# Patient Record
Sex: Female | Born: 1960 | Race: White | Hispanic: No | Marital: Married | Smoking: Never smoker
Health system: Southern US, Community
[De-identification: ages and names within clinical notes are randomized; demographics above are authoritative.]

## PROBLEM LIST (undated history)

## (undated) DIAGNOSIS — G43909 Migraine, unspecified, not intractable, without status migrainosus: Secondary | ICD-10-CM

## (undated) DIAGNOSIS — I459 Conduction disorder, unspecified: Secondary | ICD-10-CM

## (undated) HISTORY — DX: Conduction disorder, unspecified: I45.9

## (undated) HISTORY — PX: OVARIAN CYST REMOVAL: SHX89

---

## 1999-04-21 ENCOUNTER — Inpatient Hospital Stay (HOSPITAL_COMMUNITY): Admission: AD | Admit: 1999-04-21 | Discharge: 1999-04-23 | Payer: Self-pay | Admitting: Obstetrics and Gynecology

## 1999-04-24 ENCOUNTER — Encounter: Admission: RE | Admit: 1999-04-24 | Discharge: 1999-07-23 | Payer: Self-pay | Admitting: Obstetrics and Gynecology

## 1999-05-18 ENCOUNTER — Other Ambulatory Visit: Admission: RE | Admit: 1999-05-18 | Discharge: 1999-05-18 | Payer: Self-pay | Admitting: Obstetrics and Gynecology

## 2000-03-13 ENCOUNTER — Other Ambulatory Visit: Admission: RE | Admit: 2000-03-13 | Discharge: 2000-03-13 | Payer: Self-pay | Admitting: Obstetrics and Gynecology

## 2000-03-21 ENCOUNTER — Ambulatory Visit (HOSPITAL_COMMUNITY): Admission: RE | Admit: 2000-03-21 | Discharge: 2000-03-21 | Payer: Self-pay | Admitting: Obstetrics and Gynecology

## 2000-03-21 ENCOUNTER — Encounter: Payer: Self-pay | Admitting: Obstetrics and Gynecology

## 2000-03-22 ENCOUNTER — Ambulatory Visit (HOSPITAL_COMMUNITY): Admission: RE | Admit: 2000-03-22 | Discharge: 2000-03-22 | Payer: Self-pay | Admitting: Obstetrics and Gynecology

## 2000-03-22 ENCOUNTER — Encounter (INDEPENDENT_AMBULATORY_CARE_PROVIDER_SITE_OTHER): Payer: Self-pay

## 2001-02-08 ENCOUNTER — Inpatient Hospital Stay (HOSPITAL_COMMUNITY): Admission: AD | Admit: 2001-02-08 | Discharge: 2001-02-09 | Payer: Self-pay | Admitting: Obstetrics and Gynecology

## 2001-04-17 ENCOUNTER — Other Ambulatory Visit: Admission: RE | Admit: 2001-04-17 | Discharge: 2001-04-17 | Payer: Self-pay | Admitting: Obstetrics and Gynecology

## 2004-04-21 ENCOUNTER — Encounter: Admission: RE | Admit: 2004-04-21 | Discharge: 2004-04-21 | Payer: Self-pay | Admitting: Family Medicine

## 2005-12-05 ENCOUNTER — Emergency Department (HOSPITAL_COMMUNITY): Admission: EM | Admit: 2005-12-05 | Discharge: 2005-12-06 | Payer: Self-pay | Admitting: Emergency Medicine

## 2008-06-30 HISTORY — PX: TRANSTHORACIC ECHOCARDIOGRAM: SHX275

## 2008-07-18 ENCOUNTER — Emergency Department (HOSPITAL_COMMUNITY): Admission: EM | Admit: 2008-07-18 | Discharge: 2008-07-18 | Payer: Self-pay | Admitting: Emergency Medicine

## 2010-08-12 LAB — CBC
HCT: 38.2 % (ref 36.0–46.0)
Hemoglobin: 13.1 g/dL (ref 12.0–15.0)
MCHC: 34.4 g/dL (ref 30.0–36.0)
MCV: 91.3 fL (ref 78.0–100.0)
Platelets: 161 10*3/uL (ref 150–400)
RBC: 4.19 MIL/uL (ref 3.87–5.11)
RDW: 13.4 % (ref 11.5–15.5)
WBC: 6.7 10*3/uL (ref 4.0–10.5)

## 2010-08-12 LAB — LIPID PANEL
Cholesterol: 174 mg/dL (ref 0–200)
HDL: 54 mg/dL (ref >39)
LDL Cholesterol: 115 mg/dL — ABNORMAL HIGH (ref 0–99)
Total CHOL/HDL Ratio: 3.2 RATIO
Triglycerides: 23 mg/dL (ref <150)
VLDL: 5 mg/dL (ref 0–40)

## 2010-08-12 LAB — CK TOTAL AND CKMB (NOT AT ARMC)
CK, MB: 0.5 ng/mL (ref 0.3–4.0)
Total CK: 52 U/L (ref 7–177)

## 2010-08-12 LAB — COMPREHENSIVE METABOLIC PANEL
Albumin: 4.1 g/dL (ref 3.5–5.2)
BUN: 7 mg/dL (ref 6–23)
Creatinine, Ser: 0.63 mg/dL (ref 0.4–1.2)
Total Protein: 7.1 g/dL (ref 6.0–8.3)

## 2010-08-12 LAB — DIFFERENTIAL
Basophils Absolute: 0 10*3/uL (ref 0.0–0.1)
Lymphocytes Relative: 11 % — ABNORMAL LOW (ref 12–46)
Lymphs Abs: 0.7 10*3/uL (ref 0.7–4.0)
Monocytes Absolute: 0.2 10*3/uL (ref 0.1–1.0)
Monocytes Relative: 4 % (ref 3–12)
Neutro Abs: 5.7 10*3/uL (ref 1.7–7.7)

## 2010-08-12 LAB — TSH: TSH: 2.782 u[IU]/mL (ref 0.350–4.500)

## 2010-09-17 NOTE — H&P (Signed)
[facility]  Patient:    Yvonne Parker, Yvonne Parker Visit Number: [id number] MRN: [id number]          Service Type: Attending:  Duke Salvia. Marcelle Overlie, M.D. Dictated by:   Duke Salvia. Marcelle Overlie, M.D.                           History and Physical  SCHEDULED ADMISSION DATE:     February 08, 2001.  CHIEF COMPLAINT:              For labor induction at term.  HISTORY OF PRESENT ILLNESS:   This is a 50 year old, G5, P2-0-0-2, EDD by dates of October 20, by 17-week ultrasound October 16, presents for labor induction with a favorable cervix at 38-1/2 weeks due to history of rapid labors. Her group B strep screen was negative. Blood type is A positive. Rubella titer is positive. Last labor in 2000 was less than one hour from beginning to delivery and she has been 3 cm since 36 weeks.  PAST MEDICAL HISTORY:         She had a 4-cm fibroid noted at screening ultrasound, ectopic x 1, two vaginal deliveries and a D&E for an SAB. Also, surgery for a prior ruptured ovarian cyst.  ALLERGIES:                    PENICILLIN and SULFA.  PHYSICAL EXAMINATION:  VITAL SIGNS:                  Temperature 98.2, blood pressure 124/78.  HEENT:                        Unremarkable.  NECK:                         Supple without masses.  LUNGS:                        Clear.  CARDIOVASCULAR:               Regular rate and rhythm without murmurs, rubs, gallops.  BREASTS:                      Not examined.  ABDOMEN:                      Term fundal height. Fetal heart rate 140.  PELVIC:                       Cervix was 3, 50%, and vertex.  EXTREMITIES:                  Unremarkable.  NEUROLOGIC:                   Unremarkable.  IMPRESSION:                   1. A 38-1/2 week intrauterine pregnancy.                               2. History of rapid labors.  PLAN:                         AROM induction. Dictated by:   Duke Salvia.  Marcelle Overlie, M.D. Attending:  Duke Salvia. Marcelle Overlie,  M.D. DD:  02/07/01 TD:  02/07/01 Job: [id number]

## 2010-09-17 NOTE — H&P (Signed)
[facility]  Patient:    Yvonne Parker, GRAS                      MRN: [id number] Adm. Date:  03/22/00 Attending:  Guy Sandifer. Arleta Creek, M.D.                         History and Physical  CHIEF COMPLAINT:              Missed abortion.  HISTORY OF PRESENT ILLNESS:   This patient is a 50 year old married, white female, G4, P2, ectopic 1 with a last menstrual period of January 10, 2000. She was initially seen on March 13, 2000, when she was 10-2/7 weeks by dates.  Ultrasound was done because of size less than dates on physical examination.  At that time an intrauterine gestational sac consistent with eight weeks and one day was noted.  No fetal pole was noted.  A 3.5 cm left corpus luteum cyst was noted.  Quantative hCGs were obtained and were 89,673 on March 13, 2000, and 91,347 on March 15, 2000.  Ultrasound on March 21, 2000, reveals an empty intrauterine gestational sac consistent with seven weeks and two days.  Small adjacent cyst in the ______ .  Left corpus luteum cyst measures 3.4 cm.  Diagnosis of missed abortion is discussed with the patient and her husband.  Options of management is discussed.  A D&C is recommended.  Risks and complications are discussed but not limited to infection, bleeding and uterine perforation with organ damage.  All questions are answered.  The patient is being admitted for dilation and evacuation.  PAST MEDICAL HISTORY:         Migraine headaches.  PAST SURGICAL HISTORY:        Wisdom teeth removal.  Ectopic pregnancy in 1988.  FAMILY HISTORY:               Insulin-dependent diabetes in maternal grandmother.  PKU in sister.  OBSTETRICAL HISTORY:          A vaginal delivery x 2.  Ectopic pregnancy x 1.  SOCIAL HISTORY:               The patient denies tobacco, alcohol or drug abuse.  MEDICATIONS:                  Prenatal vitamins.  ALLERGIES:                    PENICILLIN and SULFA.  REVIEW OF  SYSTEMS:            Negative except as above.  PHYSICAL EXAMINATION:  VITAL SIGNS:                  Height 5 feet 9 inches, weight 145 pounds. Blood pressure 104/70.  HEENT:                        Without thyromegaly.  LUNGS:                        Clear to auscultation.  HEART:                        Regular rate and rhythm.  BACK:  Without CVA tenderness.  BREASTS:                      Without masses, retractions or discharge.  ABDOMEN:                      Soft, nontender without masses.  PELVIC:                       Vulva, vagina and cervix without lesions. Uterus is eight weeks in size.  Adnexa nontender without masses.  EXTREMITIES:                  Grossly within normal limits.  NEUROLOGICAL:                 Grossly within normal limits.  LABORATORY DATA:              Blood type is A positive.  ASSESSMENT:                   Missed abortion.  PLAN:                         Dilation and evacuation. DD:  03/21/00 TD:  03/21/00 Job: [id number]

## 2010-09-17 NOTE — Op Note (Signed)
[facility]  Patient:    Yvonne Parker, Yvonne Parker                    MRN: [id number] Proc. Date: 03/22/00 Adm. Date:  [id number] Attending:  Cordelia Pen Ii                           Operative Report  PREOPERATIVE DIAGNOSIS:         Missed abortion.  POSTOPERATIVE DIAGNOSIS:        Missed abortion.  OPERATION:                      Dilatation and evacuation.  SURGEON:                        Guy Sandifer. Arleta Creek, M.D.  ANESTHESIA:                     MAC with 1% Xylocaine paracervical block.  ANESTHESIOLOGIST:               Ellison Hughs., M.D.  ESTIMATED BLOOD LOSS:           50 cc.  INDICATIONS AND CONSENT:        The patient is a 50 year old married white female, G4, P2, ectopic 1, with an LMP of January 10, 2000.  She has a missed abortion.  Details are dictated in the admission history and physical. Dilatation and evacuation is discussed.  Possible risks and complications are discussed the day before and the day of surgery with the patient and her husband including but not limited to infection, uterine perforation, organ damage, bleeding and transfusion of blood products.  All questions were answered and consent is signed on the chart.  DESCRIPTION OF PROCEDURE:       The patient was taken to the operating room and placed in the dorsal supine position were sedation was given.  She was then placed in the dorsal lithotomy position where she was prepped, bladder straight catheterized and she was draped in a sterile fashion.  Examination reveals the cervix to be closed, uterus to be approximately 8 weeks in size. Bivalve speculum was placed in the vagina and an endocervical polyp is noted. The polyp is removed in a simple fashion with ring clamp.  The anterior lip of the cervix is then injected with 1% Xylocaine and grasped with a single-tooth tenaculum.  The paracervical block is placed at 2, 4, 5, 7, 8 and 10 oclock positions  with 1% Xylocaine.  The cervix is then gently and progressively dilated to a 31 Pratt dilator.  A #8 curved curet was then placed in the uterus and suction curettage was carried out for obvious products of conception.  Then 10 units of Pitocin was added to the remaining 400-500 cc of IV fluids.  Alternating sharp and suction curettage was carried out until the cavity was clean.  Good hemostasis is noted.  The procedure was terminated. All counts were terminated.  The patient was awakened and taken to the recovery room in stable condition.  Blood type is A positive.  Methergine 0.2 mg p.o. t.i.d. for six doses was given.  Follow up in the office in two weeks. DD:  03/22/00 TD:  03/23/00 Job: [id number]

## 2011-10-26 ENCOUNTER — Encounter (HOSPITAL_COMMUNITY): Payer: Self-pay | Admitting: Neurology

## 2011-10-26 ENCOUNTER — Emergency Department (HOSPITAL_COMMUNITY)
Admission: EM | Admit: 2011-10-26 | Discharge: 2011-10-26 | Disposition: A | Discharge status: Home / Self Care | Payer: 59 | Attending: Emergency Medicine | Admitting: Emergency Medicine

## 2011-10-26 ENCOUNTER — Emergency Department (HOSPITAL_COMMUNITY): Payer: 59

## 2011-10-26 DIAGNOSIS — S060X1A Concussion with loss of consciousness of 30 minutes or less, initial encounter: Secondary | ICD-10-CM | POA: Insufficient documentation

## 2011-10-26 DIAGNOSIS — S1093XA Contusion of unspecified part of neck, initial encounter: Secondary | ICD-10-CM | POA: Insufficient documentation

## 2011-10-26 DIAGNOSIS — S060XAA Concussion with loss of consciousness status unknown, initial encounter: Secondary | ICD-10-CM

## 2011-10-26 DIAGNOSIS — S0003XA Contusion of scalp, initial encounter: Secondary | ICD-10-CM | POA: Insufficient documentation

## 2011-10-26 DIAGNOSIS — G43909 Migraine, unspecified, not intractable, without status migrainosus: Secondary | ICD-10-CM | POA: Insufficient documentation

## 2011-10-26 DIAGNOSIS — R42 Dizziness and giddiness: Secondary | ICD-10-CM | POA: Insufficient documentation

## 2011-10-26 DIAGNOSIS — W010XXA Fall on same level from slipping, tripping and stumbling without subsequent striking against object, initial encounter: Secondary | ICD-10-CM | POA: Insufficient documentation

## 2011-10-26 DIAGNOSIS — W19XXXA Unspecified fall, initial encounter: Secondary | ICD-10-CM

## 2011-10-26 DIAGNOSIS — Y92009 Unspecified place in unspecified non-institutional (private) residence as the place of occurrence of the external cause: Secondary | ICD-10-CM | POA: Insufficient documentation

## 2011-10-26 DIAGNOSIS — S060X9A Concussion with loss of consciousness of unspecified duration, initial encounter: Secondary | ICD-10-CM

## 2011-10-26 DIAGNOSIS — R51 Headache: Secondary | ICD-10-CM | POA: Insufficient documentation

## 2011-10-26 HISTORY — DX: Migraine, unspecified, not intractable, without status migrainosus: G43.909

## 2011-10-26 NOTE — Discharge Instructions (Signed)
Head Injury, Adult You have had a head injury that does not appear serious at this time. A concussion is a state of changed mental ability, usually from a blow to the head. You should take clear liquids for the rest of the day and then resume your regular diet. You should not take sedatives or alcoholic beverages for as long as directed by your caregiver after discharge. After injuries such as yours, most problems occur within the first 24 hours. SYMPTOMS These minor symptoms may be experienced after discharge:  Memory difficulties.   Dizziness.   Headaches.   Double vision.   Hearing difficulties.   Depression.   Tiredness.   Weakness.   Difficulty with concentration.  If you experience any of these problems, you should not be alarmed. A concussion requires a few days for recovery. Many patients with head injuries frequently experience such symptoms. Usually, these problems disappear without medical care. If symptoms last for more than one day, notify your caregiver. See your caregiver sooner if symptoms are becoming worse rather than better. HOME CARE INSTRUCTIONS   During the next 24 hours you must stay with someone who can watch you for the warning signs listed below.  Although it is unlikely that serious side effects will occur, you should be aware of signs and symptoms which may necessitate your return to this location. Side effects may occur up to 7 - 10 days following the injury. It is important for you to carefully monitor your condition and contact your caregiver or seek immediate medical attention if there is a change in your condition. SEEK IMMEDIATE MEDICAL CARE IF:   There is confusion or drowsiness.   You can not awaken the injured person.   There is nausea (feeling sick to your stomach) or continued, forceful vomiting.   You notice dizziness or unsteadiness which is getting worse, or inability to walk.   You have convulsions or unconsciousness.   You  experience severe, persistent headaches not relieved by over-the-counter or prescription medicines for pain. (Do not take aspirin as this impairs clotting abilities). Take other pain medications only as directed.   You can not use arms or legs normally.   There is clear or bloody discharge from the nose or ears.  MAKE SURE YOU:   Understand these instructions.   Will watch your condition.   Will get help right away if you are not doing well or get worse.  Document Released: 04/18/2005 Document Revised: 04/07/2011 Document Reviewed: 03/06/2009 [facility] Patient Information 2012 [place], [place].  Scalp Hematoma A bruise of the scalp causes swelling from an accumulation of blood in the area of the injury. This is a common problem. This problem is also called a scalp hematoma. This normally is not serious. Usually a scalp hematoma causes only mild local pain or headache. It usually disappears after 2 to 3 days with proper treatment.  You should apply ice packs to the swollen area for 20 to 30 minutes every 2 to 3 hours until the swelling improves. Only take over-the-counter or prescription medicines for pain and discomfort as directed by your caregiver. It is best to avoid aspirin because this may increase bleeding. You may have a mild headache, slight dizziness, nausea, or weakness for the next few days. This usually clears up with rest.  SEEK IMMEDIATE MEDICAL CARE IF:  You develop severe pain or headache, unrelieved by medicine.   You develop unusual sleepiness, confusion, personality changes, or difficulty with coordination or walking.   You  develop a persistent nose bleed, double or blurred vision, or unusual drainage from the nose or ear.   You develop numbness or weakness in the limbs.  Document Released: 05/26/2004 Document Revised: 04/07/2011 Document Reviewed: 03/03/2009 [facility] Patient Information 2012 [place], [place].

## 2011-10-26 NOTE — ED Provider Notes (Signed)
History     CSN: [id number]  Arrival date & time 10/26/11  4098   First MD Initiated Contact with Patient 10/26/11 [phone]      Chief Complaint  Patient presents with  . Fall    (Consider location/radiation/quality/duration/timing/severity/associated sxs/prior treatment) HPI  51 year old female presents complaining of fall. Patient states she was letting her dog outside this a.m. and while she was in a hurry she slipped on a throw rug, fell backward and hits her head against the wall (against wall molding). Patient has a positive loss of consciousness.  Sts she was somewhat confused after the fall and felt dizzy afterward.  C/o pain to back of head.  Denies any other trauma.  Denies change in vision, facial pain, neck pain, numbness or weakness.  LMP 3 weeks ago.  Denies any recent alcohol or rec drug use.  Husband were nearby when she fell.  Pt denies any precipitating sxs prior to fall.    Past Medical History  Diagnosis Date  . Migraines     No past surgical history on file.  No family history on file.  History  Substance Use Topics  . Smoking status: Not on file  . Smokeless tobacco: Not on file  . Alcohol Use:     OB History    Grav Para Term Preterm Abortions TAB SAB Ect Mult Living                  Review of Systems  Constitutional: Negative for fever, activity change and fatigue.  HENT: Negative for facial swelling.   Eyes: Negative for pain.  Neurological: Positive for dizziness, light-headedness and headaches. Negative for speech difficulty, weakness and numbness.    Allergies  Penicillins and Sulfa antibiotics  Home Medications  No current outpatient prescriptions on file.  There were no vitals taken for this visit.  Physical Exam  Nursing note and vitals reviewed. Constitutional: She is oriented to person, place, and time. She appears well-developed and well-nourished. No distress.  HENT:  Head: Normocephalic.  Right Ear: External ear normal.    Left Ear: External ear normal.  Nose: Nose normal.       Contusion noted to occipital protuberance with tenderness on palpation but no obvious deformity, or break in skin.    Eyes: Conjunctivae and EOM are normal. Pupils are equal, round, and reactive to light.  Neck: Normal range of motion. Neck supple.  Cardiovascular: Normal rate and regular rhythm.   Pulmonary/Chest: Effort normal and breath sounds normal. No respiratory distress. She exhibits no tenderness.  Abdominal: There is no tenderness.  Musculoskeletal: Normal range of motion. She exhibits no edema and no tenderness.  Neurological: She is alert and oriented to person, place, and time. She has normal reflexes. She displays normal reflexes. No cranial nerve deficit. She exhibits normal muscle tone. Coordination normal.       Negative Romberg. Normal finger to nose, heels to shin, normal gait.  Patella DTR 2+ bilat.    Skin: Skin is warm.  Psychiatric: She has a normal mood and affect.    ED Course  Procedures (including critical care time)  Labs Reviewed - No data to display No results found.   Ct Head Wo Contrast  10/26/2011  *RADIOLOGY REPORT*  Clinical Data: Fall striking occiput on door, loss of consciousness, dizziness, confusion, soft tissue swelling  CT HEAD WITHOUT CONTRAST  Technique:  Contiguous axial images were obtained from the base of the skull through the vertex without contrast.  Comparison:  None  Findings: Normal ventricular morphology. No midline shift or mass effect. Normal appearance of brain parenchyma. No intracranial hemorrhage, mass lesion, or acute infarction. Visualized paranasal sinuses and mastoid air cells clear. Bones unremarkable. Left occipital scalp soft tissue swelling and small hematoma identified.  IMPRESSION: No acute intracranial abnormalities.  Original Report Authenticated By: Lollie Marrow, M.D.    1. Fall 2. Concussion 3. Scalp hematoma  MDM  Patient with mechanical fall, and has a  hematoma to the back of her head. She is otherwise in no acute distress. No focal neuro deficit. She is alert and oriented x4.     9:04 AM CT of head reveals no acute fractures or bleeding. Result discussed with patient.  Recommend bedrest and avoid stimulated activity x 24 hrs.  Pt voice understanding.  Work note given.       Fayrene Helper, PA-C 10/26/11 [phone]

## 2011-10-26 NOTE — ED Notes (Signed)
Per ems- Pt fell this morning, slipped on rug which was on hardwood floors, hit head on door. Positive LOC for a short time, fall witnessed by husband, confusion after the fall. Pt has hematoma to back of head. Pt initially c/o nausea, currently dizzy. Pt a x 4. 118/72, HR 89. CBG 96.

## 2011-10-26 NOTE — ED Provider Notes (Signed)
Medical screening examination/treatment/procedure(s) were performed by non-physician practitioner and as supervising physician I was immediately available for consultation/collaboration.  Raahim Shartzer K Linker, MD 10/26/11 0918 

## 2014-05-09 ENCOUNTER — Telehealth: Payer: Self-pay | Admitting: Internal Medicine

## 2014-05-09 NOTE — Telephone Encounter (Signed)
Received records from [facility] (Dr Deland Pretty) for appointment on 05/30/14 with Dr Debara Pickett.  Records given to [facility] (medical records) for Dr [facility] schedule on 05/30/14.  lp

## 2014-05-30 ENCOUNTER — Encounter: Payer: Self-pay | Admitting: Internal Medicine

## 2014-05-30 ENCOUNTER — Ambulatory Visit (INDEPENDENT_AMBULATORY_CARE_PROVIDER_SITE_OTHER): Payer: 59 | Admitting: Internal Medicine

## 2014-05-30 VITALS — BP 126/78 | HR 110 | Ht 69.0 in | Wt 142.5 lb

## 2014-05-30 DIAGNOSIS — I452 Bifascicular block: Secondary | ICD-10-CM | POA: Insufficient documentation

## 2014-05-30 NOTE — Progress Notes (Signed)
OFFICE NOTE  Chief Complaint:  No complaints, told she has an abnormal EKG  Primary Care Physician: Horatio Pel, MD  HPI:  Yvonne Parker is a pleasant 54 year old female who was kindly referred to me from Dr. Pennie Banter office  For evaluation of an abnormal EKG.  She  Recently establish care with them and underwent a screening electrocardiogram which was abnormal demonstrating a sinus rhythm with PVCs , left axis deviation and right bundle branch block with left anterior fascicular block.  She's been completely asymptomatic. She denies any worsening shortness of breath or chest pain. She is very active and has several children. She runs fairly regularly about 4 miles several times a week without any complaints.  She tells me that she had evaluation of possibly an abnormal EKG in 2010 by Dr. Rex Kras. At that time she apparently wore monitor and had an echocardiogram, but no abnormal findings were noted.  PMHx:  Past Medical History  Diagnosis Date  . Migraines     Past Surgical History  Procedure Laterality Date  . Ovarian cyst removal      FAMHx:  No family history on file.  SOCHx:   reports that she has never smoked. She does not have any smokeless tobacco history on file. She reports that she does not drink alcohol or use illicit drugs.  ALLERGIES:  Allergies  Allergen Reactions  . Penicillins Rash and Other (See Comments)    Fever  . Sulfa Antibiotics Rash and Other (See Comments)    Body aches    ROS: A comprehensive review of systems was negative.  HOME MEDS: No current outpatient prescriptions on file.   No current facility-administered medications for this visit.    LABS/IMAGING: No results found for this or any previous visit (from the past 48 hour(s)). No results found.  VITALS: BP 126/78 mmHg  Pulse 110  Ht 5\' 9"  (1.753 m)  Wt 142 lb 8 oz (64.638 kg)  BMI 21.03 kg/m2  EXAM: General appearance: alert and no distress Neck: no carotid  bruit and no JVD Lungs: clear to auscultation bilaterally Heart: regular rate and rhythm, S1, S2 normal, no murmur, click, rub or gallop Abdomen: soft, non-tender; bowel sounds normal; no masses,  no organomegaly Extremities: extremities normal, atraumatic, no cyanosis or edema Pulses: 2+ and symmetric Skin: Skin color, texture, turgor normal. No rashes or lesions Neurologic: Grossly normal Psych: Pleasant  EKG:  sinus tachycardia with PACs at 110, left axis deviation, left anterior fascicular block, right bundle branch block  ASSESSMENT: 1.  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  PACs and/or PVCs  PLAN: 1.    Mrs. Belnap has an abnormal EKG which may be an old finding. Is not clear how long she's had abnormalities on her EKG but she was evaluated for this apparently in 2010 by Dr. Rex Kras. I will try to get those old records which are likely in storage, but would be helpful to compare her old EKG to her present EKG. If there is been no significant change in her workup was negative at that time, given the fact that she is asymptomatic now, I would not likely recommend any further workup. There is a remote possibility that her conduction system disease will worsen over time possibly necessitating a  Pacemaker at some point. She will need reevaluation at some point in the future. I will contact her wife had a opportunity to review her prior records and if any further testing is necessary we  will advise her on that.  Thanks again for the kind referral.  Pixie Casino, MD, [facility] Attending Cardiologist [facility]  HILTY,Kenneth C 05/30/2014, 1:12 PM

## 2014-05-30 NOTE — Patient Instructions (Signed)
Your physician recommends that you schedule a follow-up appointment as needed.   We will call you about your previous EKG (once located) to see if further evaluation is neccessary

## 2014-06-06 ENCOUNTER — Encounter: Payer: Self-pay | Admitting: *Deleted

## 2014-06-06 ENCOUNTER — Encounter: Payer: Self-pay | Admitting: Internal Medicine

## 2014-06-06 NOTE — Progress Notes (Signed)
I reviewed Yvonne Parker prior EKG from 2010. This demonstrated right bundle branch block with left axis deviation and a rate of 85. Therefore her EKG changes are not new. She underwent an echo for that in 2010 which showed a mobile interatrial septal, no atrial septal defect, EF greater than 55% and mild mitral regurgitation and trace tricuspid regurgitation with mild pulmonic regurgitation. Based and the fact that she is asymptomatic, did not feel that she needs any further workup. A stress test was proposing 2010 however she declined stress testing at the time.  Pixie Casino, MD, [facility] Attending Cardiologist [place]

## 2015-10-01 ENCOUNTER — Ambulatory Visit: Payer: Self-pay | Admitting: Sports Medicine

## 2016-07-19 DIAGNOSIS — Z01419 Encounter for gynecological examination (general) (routine) without abnormal findings: Secondary | ICD-10-CM | POA: Diagnosis not present

## 2016-07-19 DIAGNOSIS — Z6821 Body mass index (BMI) 21.0-21.9, adult: Secondary | ICD-10-CM | POA: Diagnosis not present

## 2016-08-18 DIAGNOSIS — M6281 Muscle weakness (generalized): Secondary | ICD-10-CM | POA: Diagnosis not present

## 2016-08-26 DIAGNOSIS — M6281 Muscle weakness (generalized): Secondary | ICD-10-CM | POA: Diagnosis not present

## 2016-09-05 DIAGNOSIS — M6281 Muscle weakness (generalized): Secondary | ICD-10-CM | POA: Diagnosis not present

## 2016-10-05 DIAGNOSIS — M6281 Muscle weakness (generalized): Secondary | ICD-10-CM | POA: Diagnosis not present

## 2016-11-08 DIAGNOSIS — L821 Other seborrheic keratosis: Secondary | ICD-10-CM | POA: Diagnosis not present

## 2016-11-08 DIAGNOSIS — D225 Melanocytic nevi of trunk: Secondary | ICD-10-CM | POA: Diagnosis not present

## 2016-11-08 DIAGNOSIS — L812 Freckles: Secondary | ICD-10-CM | POA: Diagnosis not present

## 2016-11-08 DIAGNOSIS — L820 Inflamed seborrheic keratosis: Secondary | ICD-10-CM | POA: Diagnosis not present

## 2016-11-10 DIAGNOSIS — M9901 Segmental and somatic dysfunction of cervical region: Secondary | ICD-10-CM | POA: Diagnosis not present

## 2016-11-21 DIAGNOSIS — M9901 Segmental and somatic dysfunction of cervical region: Secondary | ICD-10-CM | POA: Diagnosis not present

## 2016-11-23 DIAGNOSIS — M9901 Segmental and somatic dysfunction of cervical region: Secondary | ICD-10-CM | POA: Diagnosis not present

## 2016-11-24 DIAGNOSIS — M9901 Segmental and somatic dysfunction of cervical region: Secondary | ICD-10-CM | POA: Diagnosis not present

## 2016-11-25 DIAGNOSIS — Z139 Encounter for screening, unspecified: Secondary | ICD-10-CM | POA: Diagnosis not present

## 2016-11-28 DIAGNOSIS — M9901 Segmental and somatic dysfunction of cervical region: Secondary | ICD-10-CM | POA: Diagnosis not present

## 2016-11-30 DIAGNOSIS — M9901 Segmental and somatic dysfunction of cervical region: Secondary | ICD-10-CM | POA: Diagnosis not present

## 2016-12-08 DIAGNOSIS — M9901 Segmental and somatic dysfunction of cervical region: Secondary | ICD-10-CM | POA: Diagnosis not present

## 2016-12-19 DIAGNOSIS — M9901 Segmental and somatic dysfunction of cervical region: Secondary | ICD-10-CM | POA: Diagnosis not present

## 2016-12-22 DIAGNOSIS — M9901 Segmental and somatic dysfunction of cervical region: Secondary | ICD-10-CM | POA: Diagnosis not present

## 2016-12-27 DIAGNOSIS — M9901 Segmental and somatic dysfunction of cervical region: Secondary | ICD-10-CM | POA: Diagnosis not present

## 2016-12-29 DIAGNOSIS — M9901 Segmental and somatic dysfunction of cervical region: Secondary | ICD-10-CM | POA: Diagnosis not present

## 2017-01-04 DIAGNOSIS — M9901 Segmental and somatic dysfunction of cervical region: Secondary | ICD-10-CM | POA: Diagnosis not present

## 2017-01-25 DIAGNOSIS — M9901 Segmental and somatic dysfunction of cervical region: Secondary | ICD-10-CM | POA: Diagnosis not present

## 2017-02-08 DIAGNOSIS — M9901 Segmental and somatic dysfunction of cervical region: Secondary | ICD-10-CM | POA: Diagnosis not present

## 2017-03-01 DIAGNOSIS — M9901 Segmental and somatic dysfunction of cervical region: Secondary | ICD-10-CM | POA: Diagnosis not present

## 2017-04-19 DIAGNOSIS — M9901 Segmental and somatic dysfunction of cervical region: Secondary | ICD-10-CM | POA: Diagnosis not present

## 2017-06-14 DIAGNOSIS — M9901 Segmental and somatic dysfunction of cervical region: Secondary | ICD-10-CM | POA: Diagnosis not present

## 2017-08-17 DIAGNOSIS — M9901 Segmental and somatic dysfunction of cervical region: Secondary | ICD-10-CM | POA: Diagnosis not present

## 2017-10-02 DIAGNOSIS — M9901 Segmental and somatic dysfunction of cervical region: Secondary | ICD-10-CM | POA: Diagnosis not present

## 2017-10-12 DIAGNOSIS — Z139 Encounter for screening, unspecified: Secondary | ICD-10-CM | POA: Diagnosis not present

## 2017-11-09 DIAGNOSIS — D225 Melanocytic nevi of trunk: Secondary | ICD-10-CM | POA: Diagnosis not present

## 2017-11-09 DIAGNOSIS — L821 Other seborrheic keratosis: Secondary | ICD-10-CM | POA: Diagnosis not present

## 2017-11-09 DIAGNOSIS — D1801 Hemangioma of skin and subcutaneous tissue: Secondary | ICD-10-CM | POA: Diagnosis not present

## 2017-11-13 DIAGNOSIS — H2513 Age-related nuclear cataract, bilateral: Secondary | ICD-10-CM | POA: Diagnosis not present

## 2017-11-13 DIAGNOSIS — H53042 Amblyopia suspect, left eye: Secondary | ICD-10-CM | POA: Diagnosis not present

## 2017-12-11 DIAGNOSIS — M9901 Segmental and somatic dysfunction of cervical region: Secondary | ICD-10-CM | POA: Diagnosis not present

## 2017-12-25 DIAGNOSIS — M9901 Segmental and somatic dysfunction of cervical region: Secondary | ICD-10-CM | POA: Diagnosis not present

## 2018-01-23 DIAGNOSIS — M9901 Segmental and somatic dysfunction of cervical region: Secondary | ICD-10-CM | POA: Diagnosis not present

## 2018-02-19 DIAGNOSIS — M9901 Segmental and somatic dysfunction of cervical region: Secondary | ICD-10-CM | POA: Diagnosis not present

## 2018-02-27 DIAGNOSIS — Z01419 Encounter for gynecological examination (general) (routine) without abnormal findings: Secondary | ICD-10-CM | POA: Diagnosis not present

## 2018-02-27 DIAGNOSIS — Z6821 Body mass index (BMI) 21.0-21.9, adult: Secondary | ICD-10-CM | POA: Diagnosis not present

## 2018-03-05 DIAGNOSIS — E039 Hypothyroidism, unspecified: Secondary | ICD-10-CM | POA: Diagnosis not present

## 2018-03-05 DIAGNOSIS — Z23 Encounter for immunization: Secondary | ICD-10-CM | POA: Diagnosis not present

## 2018-04-04 DIAGNOSIS — Z1231 Encounter for screening mammogram for malignant neoplasm of breast: Secondary | ICD-10-CM | POA: Diagnosis not present

## 2018-05-10 DIAGNOSIS — Z0000 Encounter for general adult medical examination without abnormal findings: Secondary | ICD-10-CM | POA: Diagnosis not present

## 2018-05-10 DIAGNOSIS — E039 Hypothyroidism, unspecified: Secondary | ICD-10-CM | POA: Diagnosis not present

## 2018-05-10 DIAGNOSIS — N390 Urinary tract infection, site not specified: Secondary | ICD-10-CM | POA: Diagnosis not present

## 2018-05-17 DIAGNOSIS — Z0000 Encounter for general adult medical examination without abnormal findings: Secondary | ICD-10-CM | POA: Diagnosis not present

## 2018-05-23 ENCOUNTER — Ambulatory Visit (INDEPENDENT_AMBULATORY_CARE_PROVIDER_SITE_OTHER): Payer: 59 | Admitting: Internal Medicine

## 2018-05-23 ENCOUNTER — Encounter: Payer: Self-pay | Admitting: Internal Medicine

## 2018-05-23 VITALS — BP 166/70 | HR 73 | Ht 69.0 in | Wt 145.2 lb

## 2018-05-23 DIAGNOSIS — I493 Ventricular premature depolarization: Secondary | ICD-10-CM

## 2018-05-23 DIAGNOSIS — I441 Atrioventricular block, second degree: Secondary | ICD-10-CM

## 2018-05-23 DIAGNOSIS — E038 Other specified hypothyroidism: Secondary | ICD-10-CM

## 2018-05-23 NOTE — Progress Notes (Signed)
OFFICE NOTE  Chief Complaint:  Second-degree AV block  Primary Care Physician: Deland Pretty, MD  HPI:  Yvonne Parker is a pleasant 58 year old female who was kindly referred to me from Dr. Pennie Banter office  For evaluation of an abnormal EKG.  She  Recently establish care with them and underwent a screening electrocardiogram which was abnormal demonstrating a sinus rhythm with PVCs , left axis deviation and right bundle branch block with left anterior fascicular block.  She's been completely asymptomatic. She denies any worsening shortness of breath or chest pain. She is very active and has several children. She runs fairly regularly about 4 miles several times a week without any complaints.  She tells me that she had evaluation of possibly an abnormal EKG in 2010 by Dr. Rex Kras. At that time she apparently wore monitor and had an echocardiogram, but no abnormal findings were noted.  05/23/2018  Yvonne Parker is seen today in follow-up.  I last saw her in 2016 and she is actually considered a new patient.  She was previously a patient of Dr. Rex Kras for an abnormal EKG.  She had had a right bundle branch block and left anterior fascicular block with PVCs.  She was asymptomatic at the time and had undergone testing in the past including an echocardiogram without any significant findings.  She is done fairly well over the past several years although recently has had some worsening fatigue.  She also had a rising TSH and was recently started on levothyroxine.  Although she felt a lot better right away she is continued to have some fatigue several months later.  She was noted by her PCP to have a low heart rate and EKG showed second-degree type II AV block.  Unfortunately EKG was not sent for my evaluation today.  We did repeat an EKG which shows sinus rhythm and frequent PVCs.  It does appear that there are some extra P waves suggesting there may be type II AV block as well.  Further rhythm evaluation  will be necessary.  Symptom wise she does get short of breath with moderate exertion but it seems to be sporadic.  She denies any chest pain.  She is not had any presyncopal or syncopal episodes.  Other than her levothyroxine she takes no medications.  There is no history of arrhythmias or pacemaker need in the family.  PMHx:  Past Medical History:  Diagnosis Date  . Migraines     Past Surgical History:  Procedure Laterality Date  . OVARIAN CYST REMOVAL    . TRANSTHORACIC ECHOCARDIOGRAM  06/2008   EF=>55%, mobile IAS; mild MR; trace TR; mild pulm valve regurg    FAMHx:  Family History  Problem Relation Age of Onset  . Cancer Mother   . Cancer Maternal Grandfather     SOCHx:   reports that she has never smoked. She has never used smokeless tobacco. She reports that she does not drink alcohol or use drugs.  ALLERGIES:  Allergies  Allergen Reactions  . Penicillins Rash and Other (See Comments)    Fever  . Sulfa Antibiotics Rash and Other (See Comments)    Body aches    ROS: A comprehensive review of systems was negative.  HOME MEDS: Current Outpatient Medications  Medication Sig Dispense Refill  . levothyroxine (SYNTHROID, LEVOTHROID) 50 MCG tablet Take 1 tablet by mouth daily.     No current facility-administered medications for this visit.     LABS/IMAGING: No results found for this  or any previous visit (from the past 48 hour(s)). No results found.  VITALS: BP (!) 166/70   Pulse 73   Ht 5\' 9"  (1.753 m)   Wt 145 lb 3.2 oz (65.9 kg)   BMI 21.44 kg/m   EXAM: General appearance: alert and no distress Neck: no carotid bruit and no JVD Lungs: clear to auscultation bilaterally Heart: regular rate and rhythm, S1, S2 normal, no murmur, click, rub or gallop Abdomen: soft, non-tender; bowel sounds normal; no masses,  no organomegaly Extremities: extremities normal, atraumatic, no cyanosis or edema Pulses: 2+ and symmetric Skin: Skin color, texture, turgor normal.  No rashes or lesions Neurologic: Grossly normal Psych: Pleasant  EKG: Sinus rhythm with frequent PVCs, left anterior fascicular block/right bundle branch block-personally reviewed  ASSESSMENT: 1. 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 Frequent PVCs 3. Possible second-degree AV block  PLAN: 1.    Yvonne Parker was noted to have bradycardia and second-degree AV block in the office with heart rate in the 30s.  Her heart rate is faster today however her pulse rate is actually 30s or 40s due to frequent PVCs which are nonconducted.  She does have underlying AV conduction disease with a right bundle branch block and left anterior fascicular block.  I would like to arrange for 2-week monitoring to see if she is having any significant pauses or high degree AV block which might warrant pacemaker.  Unfortunately due to bradycardia and AV block it seems that we will not likely be able to suppress her PVCs with beta-blocker or calcium channel blocker.  Antiarrhythmic therapy might be an option however could also worsen her bradycardia.  I may need to refer her to EP.  Will also obtain an echocardiogram to look for any new structural heart disease.  Thanks for the kind referral.  Follow-up with me afterwards.  Pixie Casino, MD, [place], [place] Director of the [facility]  [facility] Diplomate of the American Board of Clinical Lipidology Attending Cardiologist  Direct Dial: [phone]  Fax: [phone]  Website:  [email or web address] Prioleau 05/23/2018, 10:15 AM

## 2018-05-23 NOTE — Patient Instructions (Signed)
Medication Instructions:  No changes If you need a refill on your cardiac medications before your next appointment, please call your pharmacy.   Testing/Procedures: Your physician has requested that you have an echocardiogram. Echocardiography is a painless test that uses sound waves to create images of your heart. It provides your doctor with information about the size and shape of your heart and how well your heart's chambers and valves are working. This procedure takes approximately one hour. There are no restrictions for this procedure.  Your physician has requested that you wear a monitor for TWO WEEKS  Both appointments will be scheduled @ [address] - [address] Floor   Follow-Up: At [facility], you and your health needs are our priority.  As part of our continuing mission to provide you with exceptional heart care, we have created designated Provider Care Teams.  These Care Teams include your primary Cardiologist (physician) and Advanced Practice Providers (APPs -  Physician Assistants and Nurse Practitioners) who all work together to provide you with the care you need, when you need it. . You will need a follow up appointment with Dr. Debara Pickett after your testing is completed.   Any Other Special Instructions Will Be Listed Below (If Applicable).

## 2018-05-29 DIAGNOSIS — M9901 Segmental and somatic dysfunction of cervical region: Secondary | ICD-10-CM | POA: Diagnosis not present

## 2018-05-31 ENCOUNTER — Ambulatory Visit (INDEPENDENT_AMBULATORY_CARE_PROVIDER_SITE_OTHER): Payer: 59

## 2018-05-31 ENCOUNTER — Telehealth: Payer: Self-pay | Admitting: Cardiology

## 2018-05-31 ENCOUNTER — Ambulatory Visit (HOSPITAL_COMMUNITY): Payer: 59 | Attending: Cardiovascular Disease

## 2018-05-31 DIAGNOSIS — I493 Ventricular premature depolarization: Secondary | ICD-10-CM

## 2018-05-31 DIAGNOSIS — I441 Atrioventricular block, second degree: Secondary | ICD-10-CM | POA: Insufficient documentation

## 2018-05-31 NOTE — Telephone Encounter (Signed)
I received a page from [employer] cardiac monitoring service to report that initial baseline heart rhythm when monitor applied was second-degree AV block type II with rates 39-40s.  Notes reviewed from visit with Dr. Debara Pickett indicate that he is aware of second-degree block, thus the reason for monitor.  Reviewed with Dr. Marlou Porch.  Will route this message to Dr. Debara Pickett.

## 2018-06-01 ENCOUNTER — Telehealth: Payer: Self-pay | Admitting: Internal Medicine

## 2018-06-01 DIAGNOSIS — I441 Atrioventricular block, second degree: Secondary | ICD-10-CM

## 2018-06-01 NOTE — Telephone Encounter (Signed)
Spoke with patient about echo & initial monitor results (applied yesterday). Explained that echo looked good per MD, monitor showed 2nd degree AVB (Mobitz II) with HR 30s-40s. This is noted in chart as well. Patient aware MD has recommended EP referral and agrees with this plan. Order placed. Message sent to [facility], scheduler, to contact patient for OV. She is aware to keep wearing the monitor.

## 2018-06-03 ENCOUNTER — Telehealth: Payer: Self-pay | Admitting: Nurse Practitioner

## 2018-06-03 ENCOUNTER — Telehealth: Payer: Self-pay | Admitting: Internal Medicine

## 2018-06-03 NOTE — Telephone Encounter (Signed)
Received a call from [facility] regarding monitor on Yvonne Parker. He is in 2nd degree AV block (Mobitz II) with HR in the 30s-40s. This is noted in the chart multiple times as well. The patient has been referred to EP. She had no recorded symptoms with the automatically detected heart rate. Will let Dr. Debara Pickett know the results.

## 2018-06-03 NOTE — Telephone Encounter (Signed)
   Received a call from [employer] that pt was having 2nd deg type 2 hb w/ rates in the 30's @ 5:30 this AM.  This was an autotrigger.  I called pt.  She is asymptomatic and has f/u w/ SK on 2/6.  I advised that if she has any symptoms of presyncope, syncope, fatigue, chest pain, or dyspnea, she should present to the ED for eval.  Caller verbalized understanding and was grateful for the call back.  Murray Hodgkins, NP 06/03/2018, 10:45 AM

## 2018-06-04 ENCOUNTER — Telehealth: Payer: Self-pay | Admitting: Cardiology

## 2018-06-04 NOTE — Telephone Encounter (Signed)
I received call from [employer] to report several autotriggered alerts of 2:1 AVB in the 30's overnight. It appears that this has been going on since monitor initiated. Pt has been asymptomatic and instructed on what to report and when to report to ED.  She has appt with Dr. Caryl Comes on 2/6. I will route this message to Dr. Caryl Comes and Dr. Debara Pickett.

## 2018-06-07 ENCOUNTER — Encounter: Payer: Self-pay | Admitting: Internal Medicine

## 2018-06-07 ENCOUNTER — Ambulatory Visit (INDEPENDENT_AMBULATORY_CARE_PROVIDER_SITE_OTHER): Payer: 59 | Admitting: Internal Medicine

## 2018-06-07 VITALS — BP 156/76 | HR 46 | Ht 69.0 in | Wt 148.0 lb

## 2018-06-07 DIAGNOSIS — I441 Atrioventricular block, second degree: Secondary | ICD-10-CM | POA: Diagnosis not present

## 2018-06-07 DIAGNOSIS — I493 Ventricular premature depolarization: Secondary | ICD-10-CM

## 2018-06-07 NOTE — Progress Notes (Signed)
ELECTROPHYSIOLOGY CONSULT NOTE  Patient ID: Yvonne Parker, MRN: [id number], DOB/AGE: [birth year] 58 y.o. Admit date: (Not on file) Date of Consult: 06/07/2018  Primary Physician: Deland Pretty, MD Primary Cardiologist: [facility]     Yvonne Parker is a 58 y.o. female who is being seen today for the evaluation of Heart block at the request of Dr Debara Pickett.    HPI Yvonne Parker is a 58 y.o. female  Referred because she was found to be bradycardic.   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  She was also noted to have right bundle branch block with left axis deviation.  She was at that time asymptomatic running regularly 4 miles or so  Echocardiogram demonstrated normal LV function.\ \ More recently she has had problems with fatigue and some degree of exercise intolerance. she was found to be mildly hypothyroid.  But was noted by her PCP not withstanding to have bradycardia.  An ECG demonstrated second-degree AV block when she was seen again by Dr. Debara Pickett.See Below  She she is continued to note some degree of exercise intolerance over the last 6 months or so.  She denies lightheadedness or dizziness.  No fevers or chills.  No family history        DATE TEST EF   3/10 Echo   55 %   1/20 Echo   60-65% %           Past Medical History:  Diagnosis Date  . Migraines       Surgical History:  Past Surgical History:  Procedure Laterality Date  . OVARIAN CYST REMOVAL    . TRANSTHORACIC ECHOCARDIOGRAM  06/2008   EF=>55%, mobile IAS; mild MR; trace TR; mild pulm valve regurg     Home Meds: Current Meds  Medication Sig  . levothyroxine (SYNTHROID, LEVOTHROID) 50 MCG tablet Take 1 tablet by mouth daily.    Allergies:  Allergies  Allergen Reactions  . Penicillins Rash and Other (See Comments)    Fever  . Sulfa Antibiotics Rash and Other (See Comments)    Body aches    Social History   Socioeconomic History  .  Marital status: Married    Spouse name: Not on file  . Number of children: 3  . Years of education: Bachelor's  . Highest education level: Not on file  Occupational History  . Not on file  Social Needs  . Financial resource strain: Not on file  . Food insecurity:    Worry: Not on file    Inability: Not on file  . Transportation needs:    Medical: Not on file    Non-medical: Not on file  Tobacco Use  . Smoking status: Never Smoker  . Smokeless tobacco: Never Used  Substance and Sexual Activity  . Alcohol use: No  . Drug use: No  . Sexual activity: Not on file  Lifestyle  . Physical activity:    Days per week: Not on file    Minutes per session: Not on file  . Stress: Not on file  Relationships  . Social connections:    Talks on phone: Not on file    Gets together: Not on file    Attends religious service: Not on file    Active member of club or organization: Not on file    Attends meetings of clubs or organizations: Not on file    Relationship status: Not on file  . Intimate  partner violence:    Fear of current or ex partner: Not on file    Emotionally abused: Not on file    Physically abused: Not on file    Forced sexual activity: Not on file  Other Topics Concern  . Not on file  Social History Narrative  . Not on file     Family History  Problem Relation Age of Onset  . Cancer Mother   . Cancer Maternal Grandfather      ROS:  Please see the history of present illness.     All other systems reviewed and negative.    Physical Exam:  Blood pressure (!) 156/76, pulse (!) 46, height 5\' 9"  (1.753 m), weight 148 lb (67.1 kg), SpO2 99 %. General: Well developed, well nourished female in no acute distress. Head: Normocephalic, atraumatic, sclera non-icteric, no xanthomas, nares are without discharge. EENT: normal  Lymph Nodes:  none Neck: Negative for carotid bruits. JVD not elevated. Back:without scoliosis kyphosis Lungs: Clear bilaterally to auscultation  without wheezes, rales, or rhonchi. Breathing is unlabored. Heart: slow and regular rate and rhythm murmur . No rubs, or gallops appreciated. Abdomen: Soft, non-tender, non-distended with normoactive bowel sounds. No hepatomegaly. No rebound/guarding. No obvious abdominal masses. Msk:  Strength and tone appear normal for age. Extremities: No clubbing or cyanosis. No  edema.  Distal pedal pulses are 2+ and equal bilaterally. Skin: Warm and Dry Neuro: Alert and oriented X 3. CN III-XII intact Grossly normal sensory and motor function . Psych:  Responds to questions appropriately with a normal affect.      Labs: Cardiac Enzymes No results for input(s): CKTOTAL, CKMB, TROPONINI in the last 72 hours. CBC Lab Results  Component Value Date   WBC 6.7 07/18/2008   HGB 13.1 07/18/2008   HCT 38.2 07/18/2008   MCV 91.3 07/18/2008   PLT 161 07/18/2008   PROTIME: No results for input(s): LABPROT, INR in the last 72 hours. Chemistry No results for input(s): NA, K, CL, CO2, BUN, CREATININE, CALCIUM, PROT, BILITOT, ALKPHOS, ALT, AST, GLUCOSE in the last 168 hours.  Invalid input(s): LABALBU Lipids Lab Results  Component Value Date   CHOL  07/18/2008    174        ATP III CLASSIFICATION:  <200     mg/dL   Desirable  200-239  mg/dL   Borderline High  >=240    mg/dL   High          HDL 54 07/18/2008   LDLCALC (H) 07/18/2008    115        Total Cholesterol/HDL:CHD Risk Coronary Heart Disease Risk Table                     Men   Women  1/2 Average Risk   3.4   3.3  Average Risk       5.0   4.4  2 X Average Risk   9.6   7.1  3 X Average Risk  23.4   11.0        Use the calculated Patient Ratio above and the CHD Risk Table to determine the patient's CHD Risk.        ATP III CLASSIFICATION (LDL):  <100     mg/dL   Optimal  100-129  mg/dL   Near or Above                    Optimal  130-159  mg/dL   Borderline  160-189  mg/dL   High  >190     mg/dL   Very High   TRIG 23 07/18/2008    BNP No results found for: PROBNP Thyroid Function Tests: No results for input(s): TSH, T4TOTAL, T3FREE, THYROIDAB in the last 72 hours.  Invalid input(s): FREET3 Miscellaneous No results found for: DDIMER  Radiology/Studies:  No results found.   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  In this 58 year old woman it begs the question as to cause.  Longstanding bifascicular block and now with 2: 1 heart block with antecedent Mobitz 1 block suggest disease in the AV node.  There are on the tracings to nonconducted P waves in series; in each case they follow a PVC.  I have wondered whether the first beat blocks antegrade in the second beat blocks because of retrograde penetration into the AV node from the PVC.  There were no other double missed beats.  I think she will come to pacing sooner given the fact that she is now having fatigue and some degree of exercise intolerance.  We will undertake treadmill testing to get some idea of whether the conduction problem is in the His or AV node.    As to the mechanism, will undertake cardiac MRI.  If it suggests sarcoid will need to look more systemically for evidence of lymphadenopathy for potential biopsy.  She would also need a PET scan.  At that juncture, ICD implantation as opposed to pacing would probably be the appropriate device.  BP elevation may be secondary to bradycardia and adrenalin   Have reviewed this extensively with the family.   Virl Axe

## 2018-06-07 NOTE — Patient Instructions (Addendum)
Medication Instructions:  Your physician recommends that you continue on your current medications as directed. Please refer to the Current Medication list given to you today.  Labwork: None ordered.  Testing/Procedures: Your physician has requested that you have a cardiac MRI. Cardiac MRI uses a computer to create images of your heart as its beating, producing both still and moving pictures of your heart and major blood vessels. For further information please visit [email or web address] Please follow the instruction sheet given to you today for more information.  Your physician has requested that you have an exercise tolerance test. For further information please visit [email or web address]. Please also follow instruction sheet, as given.   Follow-Up:  We will contact you after your MRI to discuss pacemaker implant.  Any Other Special Instructions Will Be Listed Below (If Applicable).     If you need a refill on your cardiac medications before your next appointment, please call your pharmacy.

## 2018-06-08 ENCOUNTER — Telehealth: Payer: Self-pay | Admitting: *Deleted

## 2018-06-08 NOTE — Telephone Encounter (Signed)
[employer] sent a serious event on pt showing AVB 2:1 AT AROUND 8:00 PM PER PT FELT FINE THINKS MAY HAVE BEEN IN SHOWER.PT SEEN DR Noralyn Pick AND FURTHER TESTING WAS ORDERED .REVIEWED BY DR CROITORU NO CHANGES AT THIS TIME .Adonis Housekeeper

## 2018-06-11 ENCOUNTER — Telehealth: Payer: Self-pay | Admitting: *Deleted

## 2018-06-11 NOTE — Telephone Encounter (Signed)
Called patient to remind her of stress test tomorrow morning at 0800. She verbalized understanding of appointment date and time, no caffeine for 24 hours before and wear walking shoes. She did say she had caffeine around 2:30pm today but not a lot. Advised that should be ok.

## 2018-06-12 ENCOUNTER — Telehealth (HOSPITAL_COMMUNITY): Payer: Self-pay | Admitting: Emergency Medicine

## 2018-06-12 ENCOUNTER — Telehealth: Payer: Self-pay

## 2018-06-12 ENCOUNTER — Ambulatory Visit: Payer: 59 | Admitting: Internal Medicine

## 2018-06-12 DIAGNOSIS — I441 Atrioventricular block, second degree: Secondary | ICD-10-CM

## 2018-06-12 DIAGNOSIS — I493 Ventricular premature depolarization: Secondary | ICD-10-CM

## 2018-06-12 NOTE — Telephone Encounter (Signed)
Spoke with pt today regarding her upcoming implant with Dr Lovena Le on 2/13. Pt has verbalized understanding of the following: NPO after midnight except her morning medication with a sip of water.  Pt will use antibacterial soap the night before and morning of procedure.  Pt will arrive at 0930 @ [place] and report to patient registration.  Pt understands she will spend the night and need a ride home at discharge.   We have set up her 91 day follow up with Dr Caryl Comes. I will have scheduling call her to set up a wound check.  Pt has no additional questions and will call if she needs anything further.

## 2018-06-12 NOTE — Progress Notes (Unsigned)
Here for treadmill today to look for response to exercise.  Modest increase in sinus rate resulted in 2: 1 heart block---3: 1 heart block suggesting Hisian block  I have discussed this with Dr. Elliot Cousin.  We will plan to proceed with implantation on Thursday.  We will undertake MRI if possible before then.  In the event that there is suggestion of sarcoid, she will receive an ICD; if not she will receive a pacemaker.  She will need post implant PET.  Also will need ches//abdominal CT looking for adenopathy.  Have reviewed the potential benefits and risks of ICD implantation including but not limited to death, perforation of heart or lung, lead dislodgement, infection,  device malfunction and inappropriate shocks.  The patient and family express understanding  and are willing to proceed.

## 2018-06-12 NOTE — Telephone Encounter (Signed)
Reaching out to patient to offer assistance regarding upcoming cardiac imaging study; pt verbalizes understanding of appt date/time, parking situation and where to check in, and verified current allergies; name and call back number provided for further questions should they arise Mcdonald Reiling RN Navigator Cardiac Imaging [facility] [phone] office [phone] cell 

## 2018-06-13 ENCOUNTER — Ambulatory Visit (HOSPITAL_COMMUNITY)
Admission: RE | Admit: 2018-06-13 | Discharge: 2018-06-13 | Disposition: A | Discharge status: Home / Self Care | Payer: 59 | Source: Ambulatory Visit | Attending: Internal Medicine | Admitting: Internal Medicine

## 2018-06-13 ENCOUNTER — Telehealth: Payer: Self-pay | Admitting: Internal Medicine

## 2018-06-13 DIAGNOSIS — I493 Ventricular premature depolarization: Secondary | ICD-10-CM

## 2018-06-13 DIAGNOSIS — I441 Atrioventricular block, second degree: Secondary | ICD-10-CM | POA: Insufficient documentation

## 2018-06-13 MED ORDER — GADOBUTROL 1 MMOL/ML IV SOLN
7.5000 mL | Freq: Once | INTRAVENOUS | Status: AC | PRN
  Administered 2018-06-13: 7.5 mL via INTRAVENOUS

## 2018-06-13 NOTE — Telephone Encounter (Signed)
New Message   Estill Bamberg is calling to get prior authorization for the PTs scheduled surgery tomorrow Please call

## 2018-06-13 NOTE — Telephone Encounter (Signed)
CALLED ASHLEY IN [place] [phone]) SHE STATES THAT SHE WILL CALL AMANDA AND TAKE CARE OF THIS

## 2018-06-14 ENCOUNTER — Other Ambulatory Visit: Payer: Self-pay

## 2018-06-14 ENCOUNTER — Encounter (HOSPITAL_COMMUNITY): Payer: Self-pay | Admitting: General Practice

## 2018-06-14 ENCOUNTER — Ambulatory Visit (HOSPITAL_COMMUNITY)
Admission: RE | Disposition: A | Discharge status: Home / Self Care | Payer: Self-pay | Source: Home / Self Care | Attending: Internal Medicine

## 2018-06-14 ENCOUNTER — Ambulatory Visit (HOSPITAL_COMMUNITY)
Admission: RE | Admit: 2018-06-14 | Discharge: 2018-06-15 | Disposition: A | Discharge status: Home / Self Care | Payer: 59 | Attending: Internal Medicine | Admitting: Internal Medicine

## 2018-06-14 DIAGNOSIS — I4510 Unspecified right bundle-branch block: Secondary | ICD-10-CM | POA: Insufficient documentation

## 2018-06-14 DIAGNOSIS — R030 Elevated blood-pressure reading, without diagnosis of hypertension: Secondary | ICD-10-CM | POA: Diagnosis not present

## 2018-06-14 DIAGNOSIS — R5383 Other fatigue: Secondary | ICD-10-CM | POA: Insufficient documentation

## 2018-06-14 DIAGNOSIS — Z882 Allergy status to sulfonamides status: Secondary | ICD-10-CM | POA: Diagnosis not present

## 2018-06-14 DIAGNOSIS — I452 Bifascicular block: Secondary | ICD-10-CM | POA: Insufficient documentation

## 2018-06-14 DIAGNOSIS — I441 Atrioventricular block, second degree: Secondary | ICD-10-CM | POA: Diagnosis present

## 2018-06-14 DIAGNOSIS — Z950 Presence of cardiac pacemaker: Secondary | ICD-10-CM

## 2018-06-14 DIAGNOSIS — E039 Hypothyroidism, unspecified: Secondary | ICD-10-CM | POA: Diagnosis not present

## 2018-06-14 DIAGNOSIS — Z79890 Hormone replacement therapy (postmenopausal): Secondary | ICD-10-CM | POA: Insufficient documentation

## 2018-06-14 DIAGNOSIS — Z880 Allergy status to penicillin: Secondary | ICD-10-CM | POA: Diagnosis not present

## 2018-06-14 HISTORY — PX: PACEMAKER IMPLANT: EP1218

## 2018-06-14 LAB — SURGICAL PCR SCREEN
MRSA, PCR: NEGATIVE
Staphylococcus aureus: NEGATIVE

## 2018-06-14 LAB — BASIC METABOLIC PANEL
Anion gap: 9 (ref 5–15)
BUN: 8 mg/dL (ref 6–20)
CO2: 23 mmol/L (ref 22–32)
CREATININE: 0.78 mg/dL (ref 0.44–1.00)
Calcium: 9.2 mg/dL (ref 8.9–10.3)
Chloride: 106 mmol/L (ref 98–111)
GFR calc non Af Amer: >60 mL/min (ref >60)
Glucose, Bld: 92 mg/dL (ref 70–99)
Potassium: 3.8 mmol/L (ref 3.5–5.1)
Sodium: 138 mmol/L (ref 135–145)

## 2018-06-14 LAB — CBC
HCT: 40.5 % (ref 36.0–46.0)
Hemoglobin: 13.7 g/dL (ref 12.0–15.0)
MCH: 30.9 pg (ref 26.0–34.0)
MCHC: 33.8 g/dL (ref 30.0–36.0)
MCV: 91.2 fL (ref 80.0–100.0)
Platelets: 177 10*3/uL (ref 150–400)
RBC: 4.44 MIL/uL (ref 3.87–5.11)
RDW: 11.9 % (ref 11.5–15.5)
WBC: 4 10*3/uL (ref 4.0–10.5)
nRBC: 0 % (ref 0.0–0.2)

## 2018-06-14 LAB — GLUCOSE, CAPILLARY: Glucose-Capillary: 79 mg/dL (ref 70–99)

## 2018-06-14 SURGERY — PACEMAKER IMPLANT

## 2018-06-14 MED ORDER — HEPARIN (PORCINE) IN NACL 1000-0.9 UT/500ML-% IV SOLN
INTRAVENOUS | Status: AC
  Filled 2018-06-14: qty 500

## 2018-06-14 MED ORDER — LIDOCAINE HCL 1 % IJ SOLN
INTRAMUSCULAR | Status: AC
  Filled 2018-06-14: qty 60

## 2018-06-14 MED ORDER — ONDANSETRON HCL 4 MG/2ML IJ SOLN: 4.0000 mg | Freq: Four times a day (QID) | INTRAMUSCULAR | Status: DC | PRN

## 2018-06-14 MED ORDER — MIDAZOLAM HCL 5 MG/5ML IJ SOLN
INTRAMUSCULAR | Status: AC
  Filled 2018-06-14: qty 5

## 2018-06-14 MED ORDER — SODIUM CHLORIDE 0.9 % IV SOLN
INTRAVENOUS | Status: DC
  Administered 2018-06-14: 10:00:00 via INTRAVENOUS

## 2018-06-14 MED ORDER — VANCOMYCIN HCL IN DEXTROSE 1-5 GM/200ML-% IV SOLN
1000.0000 mg | Freq: Two times a day (BID) | INTRAVENOUS | Status: AC
  Administered 2018-06-14: 1000 mg via INTRAVENOUS
  Filled 2018-06-14: qty 200

## 2018-06-14 MED ORDER — LIDOCAINE HCL (PF) 1 % IJ SOLN
INTRAMUSCULAR | Status: DC | PRN
  Administered 2018-06-14: 45 mL

## 2018-06-14 MED ORDER — ACETAMINOPHEN 325 MG PO TABS: 325.0000 mg | ORAL_TABLET | ORAL | Status: DC | PRN

## 2018-06-14 MED ORDER — SODIUM CHLORIDE 0.9 % IV SOLN
INTRAVENOUS | Status: AC
  Filled 2018-06-14: qty 2

## 2018-06-14 MED ORDER — SODIUM CHLORIDE 0.9 % IV SOLN
80.0000 mg | INTRAVENOUS | Status: AC
  Administered 2018-06-14: 80 mg

## 2018-06-14 MED ORDER — FENTANYL CITRATE (PF) 100 MCG/2ML IJ SOLN
INTRAMUSCULAR | Status: DC | PRN
  Administered 2018-06-14 (×2): 12.5 ug via INTRAVENOUS
  Administered 2018-06-14: 25 ug via INTRAVENOUS
  Administered 2018-06-14 (×2): 12.5 ug via INTRAVENOUS

## 2018-06-14 MED ORDER — SODIUM CHLORIDE 0.9 % IV BOLUS: 500.0000 mL | Freq: Once | INTRAVENOUS | Status: DC

## 2018-06-14 MED ORDER — CHLORHEXIDINE GLUCONATE 4 % EX LIQD: 60.0000 mL | Freq: Once | CUTANEOUS | Status: DC

## 2018-06-14 MED ORDER — VANCOMYCIN HCL IN DEXTROSE 1-5 GM/200ML-% IV SOLN
INTRAVENOUS | Status: AC
  Filled 2018-06-14: qty 200

## 2018-06-14 MED ORDER — FENTANYL CITRATE (PF) 100 MCG/2ML IJ SOLN
INTRAMUSCULAR | Status: AC
  Filled 2018-06-14: qty 2

## 2018-06-14 MED ORDER — IOPAMIDOL (ISOVUE-370) INJECTION 76%
INTRAVENOUS | Status: DC | PRN
  Administered 2018-06-14: 10 mL via INTRAVENOUS

## 2018-06-14 MED ORDER — MUPIROCIN 2 % EX OINT
TOPICAL_OINTMENT | CUTANEOUS | Status: AC
  Administered 2018-06-14: 10:00:00
  Filled 2018-06-14: qty 22

## 2018-06-14 MED ORDER — LEVOTHYROXINE SODIUM 50 MCG PO TABS
50.0000 ug | ORAL_TABLET | Freq: Every day | ORAL | Status: DC
  Administered 2018-06-15: 50 ug via ORAL
  Filled 2018-06-14: qty 1

## 2018-06-14 MED ORDER — MIDAZOLAM HCL 5 MG/5ML IJ SOLN
INTRAMUSCULAR | Status: DC | PRN
  Administered 2018-06-14 (×2): 1 mg via INTRAVENOUS
  Administered 2018-06-14: 2 mg via INTRAVENOUS
  Administered 2018-06-14 (×2): 1 mg via INTRAVENOUS

## 2018-06-14 MED ORDER — VANCOMYCIN HCL IN DEXTROSE 1-5 GM/200ML-% IV SOLN
1000.0000 mg | INTRAVENOUS | Status: AC
  Administered 2018-06-14: 1000 mg via INTRAVENOUS

## 2018-06-14 MED ORDER — HEPARIN (PORCINE) IN NACL 1000-0.9 UT/500ML-% IV SOLN
INTRAVENOUS | Status: DC | PRN
  Administered 2018-06-14: 500 mL

## 2018-06-14 SURGICAL SUPPLY — 8 items
CABLE SURGICAL S-101-97-12 (CABLE) ×2 IMPLANT
IPG PACE AZUR XT DR MRI W1DR01 (Pacemaker) IMPLANT
LEAD CAPSURE NOVUS 5076-52CM (Lead) ×1 IMPLANT
LEAD CAPSURE NOVUS 5076-58CM (Lead) ×1 IMPLANT
PACE AZURE XT DR MRI W1DR01 (Pacemaker) ×2 IMPLANT
PAD PRO RADIOLUCENT 2001M-C (PAD) ×2 IMPLANT
SHEATH CLASSIC 7F (SHEATH) ×2 IMPLANT
TRAY PACEMAKER INSERTION (PACKS) ×2 IMPLANT

## 2018-06-14 NOTE — Progress Notes (Signed)
Pt c/o feeling light headed; feeling like she was going to pass out. Pale. Head of bed down. Alert. Dr. Lovena Le made aware.

## 2018-06-14 NOTE — Progress Notes (Signed)
Patient states she feels much better. Sitting up eating [place] sandwich.

## 2018-06-14 NOTE — Discharge Instructions (Signed)
° ° °  Supplemental Discharge Instructions for  Pacemaker/Defibrillator Patients  Activity No heavy lifting or vigorous activity with your left/right arm for 6 to 8 weeks.  Do not raise your left/right arm above your head for one week.  Gradually raise your affected arm as drawn below.             06/18/2018                 06/19/2018                06/20/2018               06/21/2018 __  NO DRIVING for 1 week  ; you may begin driving on  7/79/3903   .  WOUND CARE - Keep the wound area clean and dry.  Do not get this area wet for one week. No showers for one week; you may shower on  06/20/2018   . - The tape/steri-strips on your wound will fall off; do not pull them off.  No bandage is needed on the site.  DO  NOT apply any creams, oils, or ointments to the wound area. - If you notice any drainage or discharge from the wound, any swelling or bruising at the site, or you develop a fever > 101? F after you are discharged home, call the office at once.  Special Instructions - You are still able to use cellular telephones; use the ear opposite the side where you have your pacemaker/defibrillator.  Avoid carrying your cellular phone near your device. - When traveling through airports, show security personnel your identification card to avoid being screened in the metal detectors.  Ask the security personnel to use the hand wand. - Avoid arc welding equipment, MRI testing (magnetic resonance imaging), TENS units (transcutaneous nerve stimulators).  Call the office for questions about other devices. - Avoid electrical appliances that are in poor condition or are not properly grounded. - Microwave ovens are safe to be near or to operate.

## 2018-06-14 NOTE — Discharge Summary (Addendum)
ELECTROPHYSIOLOGY PROCEDURE DISCHARGE SUMMARY    Patient ID: Yvonne Parker,  MRN: [id number], DOB/AGE: 59-Feb-1962 59 y.o.  Admit date: 06/14/2018 Discharge date: 06/15/2018  Primary Care Physician: Deland Pretty, MD  Primary Cardiologist: Dr. Debara Pickett Electrophysiologist: Dr. Caryl Comes  Primary Discharge Diagnosis:  1. Symptomatic bradycardia, progressive conduction system disease     status post pacemaker implantation this admission  Secondary Discharge Diagnosis:  1. Hypothyroidism  Allergies  Allergen Reactions  . Penicillins Rash and Other (See Comments)    Rash Did it involve swelling of the face/tongue/throat, SOB, or low BP? No Did it involve sudden or severe rash/hives, skin peeling, or any reaction on the inside of your mouth or nose? No Did you need to seek medical attention at a hospital or doctor's office? No When did it last happen?A long time ago If all above answers are "NO", may proceed with cephalosporin use.   . Sulfa Antibiotics Rash and Other (See Comments)    Body aches, feverish, red rash      Procedures This Admission:  1.  Implantation of a MDT dual chamber PPM on 06/14/2018 by Dr Lovena Le.  The patient received a Medtronic (serial number [id number]) right atrial lead and a Medtronic (serial number [id number]) right ventricular lead, Medtronic (serial number [id number]) pacemaker There were no immediate post procedure complications. 2.  CXR on 06/15/2018 demonstrated no pneumothorax status post device implantation.   Brief HPI: Yvonne Parker is a 58 y.o. female was referred to electrophysiology in the outpatient setting for consideration of PPM implantation.  Past medical history includes above.  The patient has had symptomatic bradycardia without reversible causes identified.  Risks, benefits, and alternatives to PPM implantation were reviewed with the patient who wished to proceed.   Hospital Course:  The patient was admitted and underwent  implantation of a PPM with details as outlined above.  She  Developed what was likely a vagal episode in post-procedure holding area with symptomatic hypotension responded well to IVF.  This morning she was markedly orthostatic with standing BP to 60's with symptoms.  Resolved with back in bed and recovered BP and symptoms.  She received more IVF and prior to discharge able to ambulate with a standing SBP 120's, and after standing a few minutes remained 120's,  Dr. Lovena Le discussed with her at length importance of adequate hydration salt in her diet.  The patient mentions she had been minimizing sodium with concerns of some higher then ususualy numbers at a couple doctor visits.  TTE noted trivial pericardial effusion, given no CP, not felt to be procedure related or the cause of her orthostatic BP.    For now, he recommended to her that she not avoid salt and in the future if in  Fact she develops high BP will need to revisit strategy.  She was monitored on telemetry overnight which demonstrated SR/VP infrequently intrinsic conduction occurs..  Left chest was without hematoma or ecchymosis.  The device was interrogated and found to be functioning normally.  CXR was obtained and demonstrated no pneumothorax status post device implantation.  Wound care, arm mobility, and restrictions were reviewed with the patient.  The patient feels well, no CP or SOB, minimal implant site discomfort.  She was examined by Dr. Lovena Le and considered stable for discharge to home.    Physical Exam: Vitals:   06/15/18 0705 06/15/18 0708 06/15/18 0711 06/15/18 1100  BP: 104/75 (!) 91/59 (!) 66/52 (!) 104/55  Pulse: 69 87 60 76  Resp:      Temp:      TempSrc:      SpO2: 100% 99% 100%   Weight:      Height:        GEN- The patient is well appearing, alert and oriented x 3 today.   HEENT: normocephalic, atraumatic; sclera clear, conjunctiva pink; hearing intact; oropharynx clear; neck supple, no JVP Lungs-  CTA b/l, normal  work of breathing.  No wheezes, rales, rhonchi Heart- RRR, no murmurs, rubs or gallops, PMI not laterally displaced GI- soft, non-tender, non-distended Extremities- no clubbing, cyanosis, or edema MS- no significant deformity or atrophy Skin- warm and dry, no rash or lesion, left chest without hematoma/ecchymosis Psych- euthymic mood, full affect Neuro- no gross deficits   Labs:   Lab Results  Component Value Date   WBC 4.0 06/14/2018   HGB 13.7 06/14/2018   HCT 40.5 06/14/2018   MCV 91.2 06/14/2018   PLT 177 06/14/2018    Recent Labs  Lab 06/14/18 1057  NA 138  K 3.8  CL 106  CO2 23  BUN 8  CREATININE 0.78  CALCIUM 9.2  GLUCOSE 92    Discharge Medications:  Allergies as of 06/15/2018      Reactions   Penicillins Rash, Other (See Comments)   Rash Did it involve swelling of the face/tongue/throat, SOB, or low BP? No Did it involve sudden or severe rash/hives, skin peeling, or any reaction on the inside of your mouth or nose? No Did you need to seek medical attention at a hospital or doctor's office? No When did it last happen?A long time ago If all above answers are "NO", may proceed with cephalosporin use.   Sulfa Antibiotics Rash, Other (See Comments)   Body aches, feverish, red rash       Medication List    TAKE these medications   levothyroxine 50 MCG tablet Commonly known as:  SYNTHROID, LEVOTHROID Take 50 mcg by mouth daily.       Disposition: Home Discharge Instructions    Diet - low sodium heart healthy   Complete by:  As directed    Increase activity slowly   Complete by:  As directed      Follow-up Information    [facility].   Specialty:  Cardiology Why:  06/28/2018 @ 12;30PM, wound check visit Contact information: [address], [address] [place]       Deboraha Sprang, MD Follow up.   Specialty:  Cardiology Why:  09/13/2018 @ 3:00PM Contact  information: [address] [place] [address] [phone]           Duration of Discharge Encounter: Greater than 30 minutes including physician time.  Venetia Night, PA-C 06/15/2018 1:37 PM   EP Attending  Patient seen and examined. Agree with the findings as noted above. The patient is s/p PPM insertion. Her device is working normally this morning. PPM interogation under my supervision demonstrates normal DDD function. She demonstrated orthostasis after her PPM including today. She has been hydrated with IV normal saline. Her BP has improved. She had a 2D echo due to her hypotension. She did not have any chest pain. She had a trivial pericardial effusion. She has been instructed to increase the salt in her diet. Her CXR demonstrates no PTX and stable lead position.  Mikle Bosworth.D.

## 2018-06-14 NOTE — H&P (Signed)
ELECTROPHYSIOLOGY CONSULT NOTE  Patient ID: Yvonne Parker, MRN: [id number], DOB/AGE: Jun 30, 1960 58 y.o. Admit date: (Not on file) Date of Consult: 06/07/2018  Primary Physician: Deland Pretty, MD Primary Cardiologist: [facility]     Yvonne Parker is a 58 y.o. female who is being seen today for the evaluation of Heart block at the request of Dr Debara Pickett.    HPI Yvonne Parker is a 58 y.o. female  Referred because she was found to be bradycardic.   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  She was also noted to have right bundle branch block with left axis deviation.  She was at that time asymptomatic running regularly 4 miles or so  Echocardiogram demonstrated normal LV function.\ \ More recently she has had problems with fatigue and some degree of exercise intolerance. she was found to be mildly hypothyroid.  But was noted by her PCP not withstanding to have bradycardia.  An ECG demonstrated second-degree AV block when she was seen again by Dr. Debara Pickett.See Below  She she is continued to note some degree of exercise intolerance over the last 6 months or so.  She denies lightheadedness or dizziness.  No fevers or chills.  No family history        DATE TEST EF   3/10 Echo   55 %   1/20 Echo   60-65% %               Past Medical History:  Diagnosis Date  . Migraines       Surgical History:       Past Surgical History:  Procedure Laterality Date  . OVARIAN CYST REMOVAL    . TRANSTHORACIC ECHOCARDIOGRAM  06/2008   EF=>55%, mobile IAS; mild MR; trace TR; mild pulm valve regurg     Home Meds: ActiveMedications      Current Meds  Medication Sig  . levothyroxine (SYNTHROID, LEVOTHROID) 50 MCG tablet Take 1 tablet by mouth daily.      Allergies:       Allergies  Allergen Reactions  . Penicillins Rash and Other (See Comments)    Fever  . Sulfa Antibiotics Rash and Other  (See Comments)    Body aches    Social History        Socioeconomic History  . Marital status: Married    Spouse name: Not on file  . Number of children: 3  . Years of education: Bachelor's  . Highest education level: Not on file  Occupational History  . Not on file  Social Needs  . Financial resource strain: Not on file  . Food insecurity:    Worry: Not on file    Inability: Not on file  . Transportation needs:    Medical: Not on file    Non-medical: Not on file  Tobacco Use  . Smoking status: Never Smoker  . Smokeless tobacco: Never Used  Substance and Sexual Activity  . Alcohol use: No  . Drug use: No  . Sexual activity: Not on file  Lifestyle  . Physical activity:    Days per week: Not on file    Minutes per session: Not on file  . Stress: Not on file  Relationships  . Social connections:    Talks on phone: Not on file    Gets together: Not on file    Attends religious service: Not on file    Active member of club  or organization: Not on file    Attends meetings of clubs or organizations: Not on file    Relationship status: Not on file  . Intimate partner violence:    Fear of current or ex partner: Not on file    Emotionally abused: Not on file    Physically abused: Not on file    Forced sexual activity: Not on file  Other Topics Concern  . Not on file  Social History Narrative  . Not on file          Family History  Problem Relation Age of Onset  . Cancer Mother   . Cancer Maternal Grandfather      ROS:  Please see the history of present illness.     All other systems reviewed and negative.    Physical Exam:  Blood pressure (!) 156/76, pulse (!) 46, height 5\' 9"  (1.753 m), weight 148 lb (67.1 kg), SpO2 99 %. General: Well developed, well nourished female in no acute distress. Head: Normocephalic, atraumatic, sclera non-icteric, no xanthomas, nares are without discharge. EENT: normal  Lymph Nodes:   none Neck: Negative for carotid bruits. JVD not elevated. Back:without scoliosis kyphosis Lungs: Clear bilaterally to auscultation without wheezes, rales, or rhonchi. Breathing is unlabored. Heart: slow and regular rate and rhythm murmur . No rubs, or gallops appreciated. Abdomen: Soft, non-tender, non-distended with normoactive bowel sounds. No hepatomegaly. No rebound/guarding. No obvious abdominal masses. Msk:  Strength and tone appear normal for age. Extremities: No clubbing or cyanosis. No  edema.  Distal pedal pulses are 2+ and equal bilaterally. Skin: Warm and Dry Neuro: Alert and oriented X 3. CN III-XII intact Grossly normal sensory and motor function . Psych:  Responds to questions appropriately with a normal affect.                 Labs: Cardiac Enzymes RecentLabs(last2labs)  No results for input(s): CKTOTAL, CKMB, TROPONINI in the last 72 hours.   CBC RecentLabs       Lab Results  Component Value Date   WBC 6.7 07/18/2008   HGB 13.1 07/18/2008   HCT 38.2 07/18/2008   MCV 91.3 07/18/2008   PLT 161 07/18/2008     PROTIME: RecentLabs(last2labs)  No results for input(s): LABPROT, INR in the last 72 hours.   Chemistry  LastLabs  No results for input(s): NA, K, CL, CO2, BUN, CREATININE, CALCIUM, PROT, BILITOT, ALKPHOS, ALT, AST, GLUCOSE in the last 168 hours.  Invalid input(s): LABALBU   Lipids RecentLabs        Lab Results  Component Value Date   CHOL  07/18/2008    174        ATP III CLASSIFICATION:  <200     mg/dL   Desirable  200-239  mg/dL   Borderline High  >=240    mg/dL   High          HDL 54 07/18/2008   LDLCALC (H) 07/18/2008    115        Total Cholesterol/HDL:CHD Risk Coronary Heart Disease Risk Table                     Men   Women  1/2 Average Risk   3.4   3.3  Average Risk       5.0   4.4  2 X Average Risk   9.6   7.1  3 X Average Risk  23.4   11.0        Use  the calculated Patient Ratio above and the  CHD Risk Table to determine the patient's CHD Risk.        ATP III CLASSIFICATION (LDL):  <100     mg/dL   Optimal  100-129  mg/dL   Near or Above                    Optimal  130-159  mg/dL   Borderline  160-189  mg/dL   High  >190     mg/dL   Very High   TRIG 23 07/18/2008     BNP LastLabs  No results found for: PROBNP   Thyroid Function Tests:  RecentLabs(last2labs)  No results for input(s): TSH, T4TOTAL, T3FREE, THYROIDAB in the last 72 hours.  Invalid input(s): FREET3   Miscellaneous RecentLabs  No results found for: DDIMER    Radiology/Studies:  ImagingResults  No results found.     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  In this 58 year old woman it begs the question as to cause.  Longstanding bifascicular block and now with 2: 1 heart block with antecedent Mobitz 1 block suggest disease in the AV node.  There are on the tracings to nonconducted P waves in series; in each case they follow a PVC.  I have wondered whether the first beat blocks antegrade in the second beat blocks because of retrograde penetration into the AV node from the PVC.  There were no other double missed beats.  I think she will come to pacing sooner given the fact that she is now having fatigue and some degree of exercise intolerance.  We will undertake treadmill testing to get some idea of whether the conduction problem is in the His or AV node.    As to the mechanism, will undertake cardiac MRI.  If it suggests sarcoid will need to look more systemically for evidence of lymphadenopathy for potential biopsy.  She would also need a PET  scan.  At that juncture, ICD implantation as opposed to pacing would probably be the appropriate device.  BP elevation may be secondary to bradycardia and adrenalin   Have reviewed this extensively with the family.   Virl Axe  EP Attending  Patient seen and examined. Agree with the findings as noted above by Dr. Renaldo Reel. She has progressive conduction system disease at a young age. No evidence of sarcoid on her initial cardiac MRI and EF is good. She will undergo insertion of a DDD PM as she has no diagnostic evidence for cardiac sarcoid and her conduction system disease does appear to be progressing.  I have reviewed the indications/risks/benefits/goals/expectations of PPM insertion with the patient and she wishes to proceed.  Cristopher Peru, M.D.

## 2018-06-15 ENCOUNTER — Encounter (HOSPITAL_COMMUNITY): Payer: Self-pay | Admitting: Internal Medicine

## 2018-06-15 ENCOUNTER — Other Ambulatory Visit (HOSPITAL_COMMUNITY): Payer: 59

## 2018-06-15 ENCOUNTER — Ambulatory Visit (HOSPITAL_COMMUNITY): Payer: 59

## 2018-06-15 ENCOUNTER — Ambulatory Visit (HOSPITAL_BASED_OUTPATIENT_CLINIC_OR_DEPARTMENT_OTHER): Payer: 59

## 2018-06-15 ENCOUNTER — Other Ambulatory Visit: Payer: Self-pay

## 2018-06-15 DIAGNOSIS — I4510 Unspecified right bundle-branch block: Secondary | ICD-10-CM | POA: Diagnosis not present

## 2018-06-15 DIAGNOSIS — E039 Hypothyroidism, unspecified: Secondary | ICD-10-CM | POA: Diagnosis not present

## 2018-06-15 DIAGNOSIS — I441 Atrioventricular block, second degree: Secondary | ICD-10-CM

## 2018-06-15 DIAGNOSIS — I452 Bifascicular block: Secondary | ICD-10-CM | POA: Diagnosis not present

## 2018-06-15 DIAGNOSIS — Z950 Presence of cardiac pacemaker: Secondary | ICD-10-CM | POA: Diagnosis not present

## 2018-06-15 LAB — ECHOCARDIOGRAM LIMITED
Height: 69 in
Weight: 2236.35 oz

## 2018-06-15 LAB — GLUCOSE, CAPILLARY: Glucose-Capillary: 115 mg/dL — ABNORMAL HIGH (ref 70–99)

## 2018-06-15 MED ORDER — SODIUM CHLORIDE 0.9 % IV SOLN
INTRAVENOUS | Status: DC
  Administered 2018-06-15: 08:00:00 via INTRAVENOUS

## 2018-06-15 MED FILL — Lidocaine HCl Local Inj 1%: INTRAMUSCULAR | Qty: 60 | Status: AC

## 2018-06-15 MED FILL — Gentamicin Sulfate Inj 40 MG/ML: INTRAMUSCULAR | Qty: 80 | Status: AC

## 2018-06-15 NOTE — Progress Notes (Signed)
Pt went down for chest xray and became lightheaded when sitting in the chair per the xray tech, pt was wheeled back to her room and verbalized feeling better. Pacemaker interrogated at bedside.

## 2018-06-15 NOTE — Progress Notes (Signed)
  Echocardiogram 2D Echocardiogram has been performed.  Jennette Dubin 06/15/2018, 8:11 AM

## 2018-06-15 NOTE — Progress Notes (Signed)
Vitals:   06/15/18 0703 06/15/18 0705 06/15/18 0708 06/15/18 0711  BP: 105/68 104/75 (!) 91/59 (!) 66/52  Pulse: (!) 59 69 87 60  Resp:      Temp:      TempSrc:      SpO2: 98% 100% 99% 100%  Weight:      Height:      Pt is positive orthostatic hypotension, Dr. Lovena Le at bedside, orders made, Dayshift RN made aware.   Aaliyah Gavel, Blanch Media, RN

## 2018-06-15 NOTE — Progress Notes (Signed)
Call placed to CCMD to notify of telemetry monitoring d/c.   

## 2018-06-15 NOTE — Progress Notes (Signed)
Orthopedic Tech Progress Note Patient Details:  Yvonne Parker 1961-03-23 [id number] Patient has on sling Patient ID: Yvonne Parker, female   DOB: 20-Oct-1960, 58 y.o.   MRN: [id number]   Yvonne Parker 06/15/2018, 8:37 AM

## 2018-06-28 ENCOUNTER — Telehealth: Payer: Self-pay | Admitting: *Deleted

## 2018-06-28 ENCOUNTER — Ambulatory Visit (INDEPENDENT_AMBULATORY_CARE_PROVIDER_SITE_OTHER): Payer: 59 | Admitting: *Deleted

## 2018-06-28 ENCOUNTER — Ambulatory Visit
Admission: RE | Admit: 2018-06-28 | Discharge: 2018-06-28 | Disposition: A | Discharge status: Home / Self Care | Payer: 59 | Source: Ambulatory Visit | Attending: Internal Medicine | Admitting: Internal Medicine

## 2018-06-28 DIAGNOSIS — Z950 Presence of cardiac pacemaker: Secondary | ICD-10-CM

## 2018-06-28 DIAGNOSIS — I441 Atrioventricular block, second degree: Secondary | ICD-10-CM

## 2018-06-28 LAB — CUP PACEART INCLINIC DEVICE CHECK
Battery Remaining Longevity: 146 mo
Brady Statistic AP VP Percent: 3.45 %
Brady Statistic AP VS Percent: 10.32 %
Brady Statistic AS VP Percent: 63.64 %
Brady Statistic AS VS Percent: 22.6 %
Brady Statistic RA Percent Paced: 13.89 %
Date Time Interrogation Session: 20200227174905
Implantable Lead Implant Date: 20200213
Implantable Lead Implant Date: 20200213
Implantable Lead Location: 753859
Implantable Lead Location: 753860
Implantable Lead Model: 5076
Implantable Pulse Generator Implant Date: 20200213
Lead Channel Impedance Value: 304 Ohm
Lead Channel Impedance Value: 399 Ohm
Lead Channel Impedance Value: 456 Ohm
Lead Channel Impedance Value: 532 Ohm
Lead Channel Pacing Threshold Amplitude: 0.75 V
Lead Channel Pacing Threshold Amplitude: 0.75 V
Lead Channel Pacing Threshold Pulse Width: 0.4 ms
Lead Channel Pacing Threshold Pulse Width: 0.4 ms
Lead Channel Sensing Intrinsic Amplitude: 23.625 mV
Lead Channel Setting Pacing Amplitude: 3.5 V
Lead Channel Setting Pacing Amplitude: 3.5 V
Lead Channel Setting Pacing Pulse Width: 0.4 ms
Lead Channel Setting Sensing Sensitivity: 2 mV
MDC IDC MSMT BATTERY VOLTAGE: 3.21 V
MDC IDC MSMT LEADCHNL RA SENSING INTR AMPL: 0.875 mV
MDC IDC STAT BRADY RV PERCENT PACED: 67.12 %

## 2018-06-28 NOTE — Progress Notes (Signed)
Wound check appointment. Steri-strips removed. Wound without redness or edema. Incision edges approximated, wound well healed. RV threshold, sensing, and impedance consistent with implant measurements. RA threshold and impedance trends stable. Bipolar P-waves measure 0.9-1.73mV today (unipolar 3.16mV), rare undersensing and safety pacing noted with RA bipolar sensitivity at 0.55mV. Prominent FFRWs noted on presentation, generally blanked with Partial+ PVAB enabled. Per GT, maintained bipolar sensing, CXR ordered--plan to consider enabling absolute PVAB and increasing RA sensitivity if CXR stable. Device programmed at 3.5V with auto capture programmed on for extra safety margin until 3 month visit. Histogram distribution appropriate for patient and level of activity. 32 mode switches--false, FFRWs. No high ventricular rates noted. Patient educated about wound care, arm mobility, lifting restrictions, and Carelink monitor. ROV with SK on 09/13/18.

## 2018-06-28 NOTE — Telephone Encounter (Signed)
Advised patient that Dr. Lovena Le reviewed CXR results--pacemaker leads are in a stable position. Plan to bring patient in for non-urgent PPM reprogramming to cover FFRWs.  Patient verbalizes understanding and is agreeable to scheduler contacting her with a DC appointment.

## 2018-06-28 NOTE — Patient Instructions (Signed)
Yvonne Parker will call you with your chest X-ray results.

## 2018-07-02 ENCOUNTER — Encounter: Payer: Self-pay | Admitting: Internal Medicine

## 2018-07-02 ENCOUNTER — Ambulatory Visit (INDEPENDENT_AMBULATORY_CARE_PROVIDER_SITE_OTHER): Payer: 59 | Admitting: Internal Medicine

## 2018-07-02 VITALS — BP 143/83 | HR 87 | Ht 69.0 in | Wt 151.4 lb

## 2018-07-02 DIAGNOSIS — Z950 Presence of cardiac pacemaker: Secondary | ICD-10-CM

## 2018-07-02 DIAGNOSIS — I441 Atrioventricular block, second degree: Secondary | ICD-10-CM | POA: Diagnosis not present

## 2018-07-02 NOTE — Patient Instructions (Signed)
Medication Instructions:  Your Physician recommend you continue on your current medication as directed.    If you need a refill on your cardiac medications before your next appointment, please call your pharmacy.   Lab work: None  Testing/Procedures: None   Follow-Up: At [facility], you and your health needs are our priority.  As part of our continuing mission to provide you with exceptional heart care, we have created designated Provider Care Teams.  These Care Teams include your primary Cardiologist (physician) and Advanced Practice Providers (APPs -  Physician Assistants and Nurse Practitioners) who all work together to provide you with the care you need, when you need it. You will need a follow up appointment as needed  Please call our office 2 months in advance to schedule this appointment.  You may see Pixie Casino, MD or one of the following Advanced Practice Providers on your designated Care Team: [place], [place] . Fabian Sharp, PA-C

## 2018-07-02 NOTE — Progress Notes (Signed)
OFFICE NOTE  Chief Complaint:  Follow-up heart block  Primary Care Physician: Deland Pretty, MD  HPI:  Yvonne Parker is a pleasant 58 year old female who was kindly referred to me from Dr. Pennie Banter office  For evaluation of an abnormal EKG.  She  Recently establish care with them and underwent a screening electrocardiogram which was abnormal demonstrating a sinus rhythm with PVCs , left axis deviation and right bundle branch block with left anterior fascicular block.  She's been completely asymptomatic. She denies any worsening shortness of breath or chest pain. She is very active and has several children. She runs fairly regularly about 4 miles several times a week without any complaints.  She tells me that she had evaluation of possibly an abnormal EKG in 2010 by Dr. Rex Kras. At that time she apparently wore monitor and had an echocardiogram, but no abnormal findings were noted.  05/23/2018  Yvonne Parker is seen today in follow-up.  I last saw her in 2016 and she is actually considered a new patient.  She was previously a patient of Dr. Rex Kras for an abnormal EKG.  She had had a right bundle branch block and left anterior fascicular block with PVCs.  She was asymptomatic at the time and had undergone testing in the past including an echocardiogram without any significant findings.  She is done fairly well over the past several years although recently has had some worsening fatigue.  She also had a rising TSH and was recently started on levothyroxine.  Although she felt a lot better right away she is continued to have some fatigue several months later.  She was noted by her PCP to have a low heart rate and EKG showed second-degree type II AV block.  Unfortunately EKG was not sent for my evaluation today.  We did repeat an EKG which shows sinus rhythm and frequent PVCs.  It does appear that there are some extra P waves suggesting there may be type II AV block as well.  Further rhythm evaluation  will be necessary.  Symptom wise she does get short of breath with moderate exertion but it seems to be sporadic.  She denies any chest pain.  She is not had any presyncopal or syncopal episodes.  Other than her levothyroxine she takes no medications.  There is no history of arrhythmias or pacemaker need in the family.  07/02/2018  Yvonne Parker is seen today in follow-up of heart block.  Is her monitor showed high degree Mobitz type II AV block for which she underwent pacemaker placement.  Since then she has noted some improvement in her symptoms although still continues to have some dizziness, neck discomfort and other symptoms which might be related to cervical spine disease.  Although she feels better, she says she is not back to what she feels like is normal.  The pacemaker did require some reprogramming and it was thought that perhaps there was a lead dislodgment however chest x-ray did not indicate that.  She has follow-up with Dr. Caryl Comes on May 14.  Other than her arrhythmia, she has no other heart problems that I am aware of.  PMHx:  Past Medical History:  Diagnosis Date  . Migraines     Past Surgical History:  Procedure Laterality Date  . OVARIAN CYST REMOVAL    . PACEMAKER IMPLANT N/A 06/14/2018   Procedure: PACEMAKER IMPLANT;  Surgeon: Evans Lance, MD;  Location: [facility];  Service: Cardiovascular;  Laterality: N/A;  .  TRANSTHORACIC ECHOCARDIOGRAM  06/2008   EF=>55%, mobile IAS; mild MR; trace TR; mild pulm valve regurg    FAMHx:  Family History  Problem Relation Age of Onset  . Cancer Mother   . Cancer Maternal Grandfather     SOCHx:   reports that she has never smoked. She has never used smokeless tobacco. She reports that she does not drink alcohol or use drugs.  ALLERGIES:  Allergies  Allergen Reactions  . Penicillins Rash and Other (See Comments)    Rash Did it involve swelling of the face/tongue/throat, SOB, or low BP? No Did it involve sudden or severe  rash/hives, skin peeling, or any reaction on the inside of your mouth or nose? No Did you need to seek medical attention at a hospital or doctor's office? No When did it last happen?A long time ago If all above answers are "NO", may proceed with cephalosporin use.   . Sulfa Antibiotics Rash and Other (See Comments)    Body aches, feverish, red rash     ROS: A comprehensive review of systems was negative.  HOME MEDS: Current Outpatient Medications  Medication Sig Dispense Refill  . levothyroxine (SYNTHROID, LEVOTHROID) 50 MCG tablet Take 50 mcg by mouth daily.      No current facility-administered medications for this visit.     LABS/IMAGING: No results found for this or any previous visit (from the past 48 hour(s)). No results found.  VITALS: BP (!) 143/83   Pulse 87   Ht 5\' 9"  (1.753 m)   Wt 151 lb 6.4 oz (68.7 kg)   LMP 10/05/2011   SpO2 100%   BMI 22.36 kg/m   EXAM: General appearance: alert and no distress Neck: no carotid bruit and no JVD Lungs: clear to auscultation bilaterally Heart: regular rate and rhythm, S1, S2 normal, no murmur, click, rub or gallop Abdomen: soft, non-tender; bowel sounds normal; no masses,  no organomegaly Extremities: extremities normal, atraumatic, no cyanosis or edema and Left upper chest pacer pocket healing without signs of infection or seroma Pulses: 2+ and symmetric Skin: Skin color, texture, turgor normal. No rashes or lesions Neurologic: Grossly normal Psych: Pleasant  EKG: Deferred  ASSESSMENT: 1. Mobitz II - 2:1 AV Block - s/p Medtronic dual-chamber PPM 2. Frequent PVCs 3. Fatigue/dizziness  PLAN: 1.    Yvonne Parker had successful placement of a Medtronic dual-chamber pacemaker for 2-1 AV block.  She had some frequent PVCs.  This is not being actively treated although may be causing some of her recurrent symptoms.  She has some fatigue and dizziness.  Although she does feel somewhat better after pacemaker  placement, she says she still does not feel normal.  I do not appreciate any other cardiac issues at this time.  Since she will be followed in the device clinic, I would not recommend any general cardiology follow-up with me at this time.  She is scheduled to see Dr. Caryl Comes in May.  Follow-up PRN with me.  Pixie Casino, MD, [facility], [place] Director of the Advanced Lipid Disorders &  Cardiovascular Risk Reduction Clinic Diplomate of the American Board of Clinical Lipidology Attending Cardiologist  Direct Dial: [phone]  Fax: [phone]  Website:  [email or web address] [email or web address] Sporn 07/02/2018, 9:01 AM

## 2018-07-06 ENCOUNTER — Telehealth: Payer: Self-pay | Admitting: Internal Medicine

## 2018-07-06 NOTE — Telephone Encounter (Signed)
Melissa  Can u schedule her to see me in about 6 weeks  Lorren can you talk with Christen Bame ( if you need to ) and arrange for PET at [facility]  Mali  Thanks  There is per MAYO discordance between sensitivity with PET and MRI

## 2018-07-06 NOTE — Telephone Encounter (Signed)
New message    Patient calling to discuss not returning to work until she has been seen on 3/19. Please call

## 2018-07-09 ENCOUNTER — Telehealth: Payer: Self-pay | Admitting: Internal Medicine

## 2018-07-09 NOTE — Telephone Encounter (Signed)
Pt calling today to discuss going back to work. 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 She currently is slated to go back to work this week and she wanted to check if this is okay.  Per Chanetta Marshall, NP pt should remain out of work for the next two weeks, returning to light duty in one weeks for one week. Pt was informed of this advisement. She is awaiting a call from medical records regarding some short term disability. I advised her to check with her employer regarding necessary documents/letters she might need for her time out and we will be happy to assist.  She has verbalized understanding and had no additional questions.

## 2018-07-09 NOTE — Telephone Encounter (Signed)
Follow Up:      Pt says she needs to talk to you again please.

## 2018-07-09 NOTE — Telephone Encounter (Signed)
Noted  

## 2018-07-09 NOTE — Telephone Encounter (Signed)
  Patient would like to know if she is clear to go back to work tomorrow (07/10/18). She would like to talk to the nurse.

## 2018-07-10 NOTE — Telephone Encounter (Signed)
Pt has agreed with PET scan. Paperwork has been faxed into [place] at [place]. Message was left for Ulice Dash to determine if any additional documents are needed. Will follow up later this week if I have not heard back.

## 2018-07-10 NOTE — Telephone Encounter (Signed)
Spoke with pt who states she needs to speak with medical records regarding her return to work letter, short term disability. I have called medical records and they are aware of pt's needs and will be in contact with her today.

## 2018-07-13 ENCOUNTER — Telehealth: Payer: Self-pay | Admitting: Internal Medicine

## 2018-07-13 NOTE — Telephone Encounter (Signed)
Spoke with patient. Informed her that there are no short term disability/FMLA forms on file from [employer]. Patient states she needs a return to work release from her provider. Patient states a letter from the provider will be sufficient.

## 2018-07-14 NOTE — Telephone Encounter (Signed)
Noted  

## 2018-07-17 ENCOUNTER — Telehealth: Payer: Self-pay | Admitting: *Deleted

## 2018-07-17 ENCOUNTER — Encounter: Payer: Self-pay | Admitting: Internal Medicine

## 2018-07-17 NOTE — Telephone Encounter (Signed)
Spoke with patient to cancel DC appointment scheduled for 07/19/18, plan for reprogramming at appointment with Dr. Caryl Comes on 08/17/18. Patient is agreeable to this plan and denies additional concerns at this time.

## 2018-07-17 NOTE — Progress Notes (Signed)
Spoke with DR Carlos Levering [phone] at [facility] re Cardiac PET in pt with normal MRI  Reviewed what I had learned from [facility]  They will undertake the study  Also encourage the reporting of SUV

## 2018-07-23 NOTE — Telephone Encounter (Signed)
Work release has been faxed to [employer], per pt's request @ [phone]. A copy has been sent to the pt in the mail as well.

## 2018-07-23 NOTE — Telephone Encounter (Signed)
F/U Message           Patient is calling checking the status of her work note, please call to advise.

## 2018-08-14 ENCOUNTER — Telehealth: Payer: Self-pay

## 2018-08-14 NOTE — Telephone Encounter (Signed)
LVM for pt to return call regarding appt on 4/17 with Dr Caryl Comes.

## 2018-08-16 ENCOUNTER — Other Ambulatory Visit: Payer: Self-pay

## 2018-08-16 ENCOUNTER — Ambulatory Visit (INDEPENDENT_AMBULATORY_CARE_PROVIDER_SITE_OTHER): Payer: 59 | Admitting: *Deleted

## 2018-08-16 DIAGNOSIS — Z950 Presence of cardiac pacemaker: Secondary | ICD-10-CM

## 2018-08-16 LAB — CUP PACEART REMOTE DEVICE CHECK
Battery Remaining Longevity: 158 mo
Battery Voltage: 3.2 V
Brady Statistic AP VP Percent: 1.65 %
Brady Statistic AP VS Percent: 25.65 %
Brady Statistic AS VP Percent: 12.07 %
Brady Statistic AS VS Percent: 60.63 %
Brady Statistic RA Percent Paced: 27.42 %
Brady Statistic RV Percent Paced: 13.86 %
Date Time Interrogation Session: 20200416001655
Implantable Lead Implant Date: 20200213
Implantable Lead Implant Date: 20200213
Implantable Lead Location: 753859
Implantable Lead Location: 753860
Implantable Lead Model: 5076
Implantable Lead Model: 5076
Implantable Pulse Generator Implant Date: 20200213
Lead Channel Impedance Value: 323 Ohm
Lead Channel Impedance Value: 380 Ohm
Lead Channel Impedance Value: 437 Ohm
Lead Channel Impedance Value: 532 Ohm
Lead Channel Pacing Threshold Amplitude: 0.5 V
Lead Channel Pacing Threshold Amplitude: 0.75 V
Lead Channel Pacing Threshold Pulse Width: 0.4 ms
Lead Channel Pacing Threshold Pulse Width: 0.4 ms
Lead Channel Sensing Intrinsic Amplitude: 1.5 mV
Lead Channel Sensing Intrinsic Amplitude: 1.5 mV
Lead Channel Sensing Intrinsic Amplitude: 20.875 mV
Lead Channel Sensing Intrinsic Amplitude: 20.875 mV
Lead Channel Setting Pacing Amplitude: 3.5 V
Lead Channel Setting Pacing Amplitude: 3.5 V
Lead Channel Setting Pacing Pulse Width: 0.4 ms
Lead Channel Setting Sensing Sensitivity: 2 mV

## 2018-08-17 ENCOUNTER — Telehealth: Payer: 59 | Admitting: Internal Medicine

## 2018-08-17 ENCOUNTER — Other Ambulatory Visit: Payer: Self-pay

## 2018-08-17 NOTE — Telephone Encounter (Signed)
Follow Up:    Pt is canceling her Virtual Visit today. She said she would wait until she could see Dr Caryl Comes. It would not let me cx it in the system.

## 2018-08-23 ENCOUNTER — Encounter: Payer: Self-pay | Admitting: Cardiology

## 2018-08-23 DIAGNOSIS — M9901 Segmental and somatic dysfunction of cervical region: Secondary | ICD-10-CM | POA: Diagnosis not present

## 2018-08-23 NOTE — Progress Notes (Signed)
Remote pacemaker transmission.   

## 2018-09-06 ENCOUNTER — Telehealth: Payer: Self-pay

## 2018-09-06 NOTE — Telephone Encounter (Signed)
I called patient about switching her office visit on 09/13/18. I see that her device needs to be reprogrammed. Does she need to come into the office and see a provider or the device clinic? I told her you would call back to discuss.

## 2018-09-07 NOTE — Telephone Encounter (Signed)
Per Dr Caryl Comes, he would like to discuss pt's PET scan with her. If she needs a device setting change, we can make a device clinic appointment at that time.

## 2018-09-13 ENCOUNTER — Other Ambulatory Visit: Payer: Self-pay

## 2018-09-13 ENCOUNTER — Telehealth: Payer: Self-pay | Admitting: *Deleted

## 2018-09-13 ENCOUNTER — Encounter: Payer: Self-pay | Admitting: Internal Medicine

## 2018-09-13 ENCOUNTER — Telehealth (INDEPENDENT_AMBULATORY_CARE_PROVIDER_SITE_OTHER): Payer: 59 | Admitting: Internal Medicine

## 2018-09-13 VITALS — Ht 69.0 in | Wt 145.0 lb

## 2018-09-13 DIAGNOSIS — Z950 Presence of cardiac pacemaker: Secondary | ICD-10-CM

## 2018-09-13 DIAGNOSIS — I441 Atrioventricular block, second degree: Secondary | ICD-10-CM

## 2018-09-13 NOTE — Telephone Encounter (Signed)
Dr. Caryl Comes sent message requesting [facility] transmission for review. Pt has [id number] monitor. Scheduled for automatic transmission on 09/14/18.

## 2018-09-13 NOTE — Progress Notes (Signed)
Electrophysiology TeleHealth Note   Due to national recommendations of social distancing due to COVID 19, an audio/video telehealth visit is felt to be most appropriate for this patient at this time.  See MyChart message from today for the patient's consent to telehealth for [facility].   Date:  09/13/2018   ID:  Yvonne Parker, DOB [birth year], MRN [id number]  Location: patient's home  Provider location: [address], [place]  Evaluation Performed: Follow-up visit  PCP:  Deland Pretty, MD  Cardiologist:     Electrophysiologist:  SK   Chief Complaint:   Heart block   History of Present Illness:    Yvonne Parker is a 58 y.o. female who presents via audio/video conferencing for a telehealth visit today. The patient did not have access to video technology/had technical difficulties with video requiring transitioning to audio format only (telephone).  All issues noted in this document were discussed and addressed.  No physical exam could be performed with this format. 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    Past Medical History:  Diagnosis Date  . Migraines     Past Surgical History:  Procedure Laterality Date  . OVARIAN CYST REMOVAL    . PACEMAKER IMPLANT N/A 06/14/2018   Procedure: PACEMAKER IMPLANT;  Surgeon: Evans Lance, MD;  Location: [facility];  Service: Cardiovascular;  Laterality: N/A;  . TRANSTHORACIC ECHOCARDIOGRAM  06/2008   EF=>55%, mobile IAS; mild MR; trace TR; mild pulm valve regurg    Current Outpatient Medications  Medication Sig Dispense Refill  . escitalopram (LEXAPRO) 10 MG tablet Take 1 tablet by mouth daily. Patient is taking 1/2 tablet for 2  days, then going to a full tablet    . levothyroxine (SYNTHROID, LEVOTHROID) 50 MCG tablet Take 50 mcg by mouth daily.      No current facility-administered medications for this visit.     Allergies:   Penicillins and Sulfa antibiotics   Social History:  The patient  reports that she has never smoked. She has never used smokeless tobacco. She reports that she does not drink alcohol or use drugs.   Family History:  The patient's   family history includes Cancer in her maternal grandfather and mother.   ROS:  Please see the history of present illness.   All other systems are personally reviewed and negative.    Exam:    Vital Signs:  Ht 5\' 9"  (1.753 m)   Wt 145 lb (65.8 kg)   LMP 10/05/2011   BMI 21.41 kg/m        Labs/Other Tests and Data Reviewed:    Recent Labs: 06/14/2018: BUN 8; Creatinine, Ser 0.78; Hemoglobin 13.7; Platelets 177; Potassium 3.8; Sodium 138   Wt Readings from Last 3 Encounters:  09/13/18 145 lb (65.8 kg)  07/02/18 151 lb 6.4 oz (68.7 kg)  06/15/18 139 lb 12.4 oz (63.4 kg)     Other studies personally reviewed:     Last device remote is reviewed from [place] PDF dated 2/20 which reveals normal device function,   arrhythmias - none  AFib false positive   ASSESSMENT & PLAN:   High grade heart block  RBBB/LAD  Pacemaker Medtronic  Tachypalpitations     much improved following pacemaker insertion  Will keep the issue of cardiac PET on the backburner as we still dont know why she ( young lady) has heart block  Tachy episode requires interrogation and would be informing also re underlying heart disease if   COVID 19 screen The patient denies symptoms of COVID 19 at this time.  The importance of social distancing was discussed today.  Follow-up:  9 m Next remote: today or tomorrow  __ sent txt already to device clinic   Current medicines are reviewed at length with the patient today.   The patient does not have concerns regarding her  medicines.  The following changes were made today:  none  Labs/ tests ordered today include:   No orders of the defined types were placed in this encounter.   Future tests ( post COVID )     Patient Risk:  after full review of this patients clinical status, I feel that they are at moderate risk at this time.  Today, I have spent 12 minutes with the patient with telehealth technology discussing the above.  Signed, Virl Axe, MD  09/13/2018 3:23 PM     [place] [address] [place] [address] [phone] (office) [phone] (fax)

## 2018-09-13 NOTE — Patient Instructions (Addendum)
Medication Instructions:  Your physician recommends that you continue on your current medications as directed. Please refer to the Current Medication list given to you today.  If you need a refill on your cardiac medications before your next appointment, please call your pharmacy.   Lab work: None Ordered  If you have labs (blood work) drawn today and your tests are completely normal, you will receive your results only by: Marland Kitchen MyChart Message (if you have MyChart) OR . A paper copy in the mail If you have any lab test that is abnormal or we need to change your treatment, we will call you to review the results.  Testing/Procedures: None ordered  Follow-Up:  . Device Clinic will call you to arrange interrogation of your device today or tomorrow  . Your physician wants you to follow-up in: 9 months with Dr. Caryl Comes. You will receive a reminder letter in the mail two months in advance. If you don't receive a letter, please call our office to schedule the follow-up appointment.  Remote monitoring is used to monitor your Pacemaker from home. This monitoring reduces the number of office visits required to check your device to one time per year. It allows [place] to keep an eye on the functioning of your device to ensure it is working properly. You are scheduled for a device check from home on 12/12/18. You may send your transmission at any time that day. If you have a wireless device, the transmission will be sent automatically. After your physician reviews your transmission, you will receive a postcard with your next transmission date. .  .    Any Other Special Instructions Will Be Listed Below (If Applicable).

## 2018-09-14 ENCOUNTER — Other Ambulatory Visit: Payer: Self-pay

## 2018-09-14 ENCOUNTER — Ambulatory Visit (INDEPENDENT_AMBULATORY_CARE_PROVIDER_SITE_OTHER): Payer: 59 | Admitting: *Deleted

## 2018-09-14 DIAGNOSIS — I441 Atrioventricular block, second degree: Secondary | ICD-10-CM | POA: Diagnosis not present

## 2018-09-14 NOTE — Telephone Encounter (Signed)
Transmission received.

## 2018-09-15 LAB — CUP PACEART REMOTE DEVICE CHECK
Battery Remaining Longevity: 149 mo
Battery Voltage: 3.19 V
Brady Statistic AP VP Percent: 0.17 %
Brady Statistic AP VS Percent: 16.01 %
Brady Statistic AS VP Percent: 1.07 %
Brady Statistic AS VS Percent: 82.75 %
Brady Statistic RA Percent Paced: 16.26 %
Brady Statistic RV Percent Paced: 1.24 %
Date Time Interrogation Session: 20200515003103
Implantable Lead Implant Date: 20200213
Implantable Lead Implant Date: 20200213
Implantable Lead Location: 753859
Implantable Lead Location: 753860
Implantable Lead Model: 5076
Implantable Lead Model: 5076
Implantable Pulse Generator Implant Date: 20200213
Lead Channel Impedance Value: 304 Ohm
Lead Channel Impedance Value: 361 Ohm
Lead Channel Impedance Value: 418 Ohm
Lead Channel Impedance Value: 494 Ohm
Lead Channel Pacing Threshold Amplitude: 0.5 V
Lead Channel Pacing Threshold Amplitude: 0.625 V
Lead Channel Pacing Threshold Pulse Width: 0.4 ms
Lead Channel Pacing Threshold Pulse Width: 0.4 ms
Lead Channel Sensing Intrinsic Amplitude: 1.875 mV
Lead Channel Sensing Intrinsic Amplitude: 1.875 mV
Lead Channel Sensing Intrinsic Amplitude: 20.75 mV
Lead Channel Sensing Intrinsic Amplitude: 20.75 mV
Lead Channel Setting Pacing Amplitude: 3.25 V
Lead Channel Setting Pacing Amplitude: 3.25 V
Lead Channel Setting Pacing Pulse Width: 0.4 ms
Lead Channel Setting Sensing Sensitivity: 2 mV

## 2018-09-17 ENCOUNTER — Encounter: Payer: Self-pay | Admitting: Cardiology

## 2018-09-17 NOTE — Progress Notes (Signed)
Remote pacemaker transmission.   

## 2018-09-20 ENCOUNTER — Telehealth: Payer: Self-pay

## 2018-09-20 NOTE — Telephone Encounter (Signed)
FMLA/disability form received from [facility]. Placed in box for Dr. Caryl Comes to sign. 09/20/18 vlm

## 2018-10-09 ENCOUNTER — Telehealth: Payer: Self-pay | Admitting: *Deleted

## 2018-10-09 NOTE — Telephone Encounter (Signed)
   [facility] Pre-operative Risk Assessment    Request for surgical clearance:  1. What type of surgery is being performed? COLONOSCOPY FOR COLON CANCER SCREENING  2. When is this surgery scheduled? 11/05/2018  3. What type of clearance is required (medical clearance vs. Pharmacy clearance to hold med vs. Both)? BOTH  4. Are there any medications that need to be held prior to surgery and how long? PENDING RECOMMENDATION  5. Practice name and name of physician performing surgery? [facility], DR. Ronnette Juniper  6. What is your office phone number [phone]   7.   What is your office fax number [phone]  8.   Anesthesia type (None, local, MAC, general) ? PENDING RECOMMENDATION   Yvonne Parker 10/09/2018, 6:31 PM  _________________________________________________________________   (provider comments below)

## 2018-10-10 NOTE — Telephone Encounter (Signed)
   Primary Cardiologist: Pixie Casino, MD  Primary Electrophysiologist: Dr. Caryl Comes   Chart reviewed as part of pre-operative protocol coverage. Given past medical history and time since last visit, based on ACC/AHA guidelines, Yvonne Parker would be at acceptable risk for the planned procedure without further cardiovascular testing.   I've reviewed medication list. No cardiac meds that need to be held.   I will route this recommendation to the requesting party via Epic fax function and remove from pre-op pool.  Please call with questions.  Lyda Jester, PA-C 10/10/2018, 8:32 AM

## 2018-12-17 ENCOUNTER — Ambulatory Visit (INDEPENDENT_AMBULATORY_CARE_PROVIDER_SITE_OTHER): Payer: 59 | Admitting: *Deleted

## 2018-12-17 DIAGNOSIS — I441 Atrioventricular block, second degree: Secondary | ICD-10-CM

## 2018-12-17 LAB — CUP PACEART REMOTE DEVICE CHECK
Battery Remaining Longevity: 161 mo
Battery Voltage: 3.14 V
Brady Statistic AP VP Percent: 19.21 %
Brady Statistic AP VS Percent: 0 %
Brady Statistic AS VP Percent: 80.75 %
Brady Statistic AS VS Percent: 0.03 %
Brady Statistic RA Percent Paced: 19.22 %
Brady Statistic RV Percent Paced: 99.97 %
Date Time Interrogation Session: 20200817085328
Implantable Lead Implant Date: 20200213
Implantable Lead Implant Date: 20200213
Implantable Lead Location: 753859
Implantable Lead Location: 753860
Implantable Lead Model: 5076
Implantable Lead Model: 5076
Implantable Pulse Generator Implant Date: 20200213
Lead Channel Impedance Value: 304 Ohm
Lead Channel Impedance Value: 380 Ohm
Lead Channel Impedance Value: 399 Ohm
Lead Channel Impedance Value: 475 Ohm
Lead Channel Pacing Threshold Amplitude: 0.625 V
Lead Channel Pacing Threshold Amplitude: 1 V
Lead Channel Pacing Threshold Pulse Width: 0.4 ms
Lead Channel Pacing Threshold Pulse Width: 0.4 ms
Lead Channel Sensing Intrinsic Amplitude: 17.625 mV
Lead Channel Sensing Intrinsic Amplitude: 17.625 mV
Lead Channel Sensing Intrinsic Amplitude: 2 mV
Lead Channel Sensing Intrinsic Amplitude: 2 mV
Lead Channel Setting Pacing Amplitude: 1.5 V
Lead Channel Setting Pacing Amplitude: 2.5 V
Lead Channel Setting Pacing Pulse Width: 0.4 ms
Lead Channel Setting Sensing Sensitivity: 2 mV

## 2018-12-21 ENCOUNTER — Telehealth: Payer: Self-pay | Admitting: *Deleted

## 2018-12-21 NOTE — Telephone Encounter (Signed)
I called Dr. Brennan Bailey office for clarification in regards to fax received today. Fax states Authorization for Release of Medical Records for the purpose of medical clearance for surgery. I left message for a call back to clarify. If needing clearance from Cardiology then we will need more information such as : Type of anesthesia, type of surgery/procedure is being performed, any meds need to be held, date of surgery, name of surgeon. Asked to please call our office back [phone] and/or fax over new clearance form with the above information.

## 2018-12-24 NOTE — Telephone Encounter (Signed)
LMOVM for Dr. Brennan Bailey office requesting call back to DC. Gave direct number for return call. Will clarify if procedure will be performed at a [facility] facility. If so, peri-op recommendations form will be faxed to [place] by pre-op area. If not, will need peri-op form faxed to DC.

## 2018-12-24 NOTE — Telephone Encounter (Signed)
Dr. Caryl Comes and Luetta Nutting -  I received a clearance request asking for clearance from the device clinic for a bilateral ear procedure using MAC. Her last remote transmission on 12/17/18 showed normal function. Does a medtronic rep need to be present for this procedure?  Thanks Angie

## 2018-12-24 NOTE — Telephone Encounter (Signed)
   Primary Cardiologist: Pixie Casino, MD  Chart reviewed as part of pre-operative protocol coverage. Patient was contacted 12/24/2018 in reference to pre-operative risk assessment for pending surgery as outlined below.  Yvonne Parker was last seen on 09/13/18 by Dr. Caryl Comes.  She had a dual chamber Medtronic pacemaker implanted 06/14/18 for symptomatic bradycardia with 2:1 AV block. Since that time, she has done well. She is very active and occasionally runs for exercise. She does not have a history of CAD or MI and is having no anginal symptoms. Last remote device transmission on 12/17/18 with normal device function.    I will route this to Dr. Caryl Comes to see if a device rep needs to be present during the surgery.  Therefore, based on ACC/AHA guidelines, the patient would be at acceptable risk for the planned procedure without further cardiovascular testing.   I will route this recommendation to the requesting party via Epic fax function and remove from pre-op pool.  Please call with questions.  [place], PA 12/24/2018, 9:08 AM

## 2018-12-24 NOTE — Telephone Encounter (Signed)
   [facility] Pre-operative Risk Assessment    Request for surgical clearance:  1. What type of surgery is being performed? OTOPLASTY   2. When is this surgery scheduled? 01/03/19   3. What type of clearance is required (medical clearance vs. Pharmacy clearance to hold med vs. Both)? MEDICAL AND CLEARANCE FROM DEVICE CLINIC   4. Are there any medications that need to be held prior to surgery and how long? NONE LISTED    5. Practice name and name of physician performing surgery? [facility] DR. Lenon Curt   6. What is your office phone number [phone]    7.   What is your office fax number [phone]  8.   Anesthesia type (None, local, MAC, general) ? CHOICE: GENERAL OR  MAC    Yvonne Parker 12/24/2018, 8:54 AM  _________________________________________________________________   (provider comments below)

## 2018-12-25 NOTE — Telephone Encounter (Signed)
   Primary Cardiologist: Pixie Casino, MD  Chart reviewed as part of pre-operative protocol coverage. Patient was contacted 12/24/2018 in reference to pre-operative risk assessment for pending surgery as outlined below. Yvonne Parker was last seen on 09/13/18 by Dr. Caryl Comes.  She had a dual chamber Medtronic pacemaker implanted 06/14/18 for symptomatic bradycardia with 2:1 AV block. Since that time, she has done well. She is very active and occasionally runs for exercise. She does not have a history of CAD or MI and is having no anginal symptoms. Last remote device transmission on 12/17/18 with normal device function.  Device clinic will be in contact with the requesting office to coordinate procedure for surgery.  Therefore, based on ACC/AHA guidelines, the patient would be at acceptable risk for the planned procedure without further cardiovascular testing.   I will route this recommendation to the requesting party via Epic fax function and remove from pre-op pool.  Please call with questions.  Tami Lin Demetrius Barrell, PA 12/25/2018, 10:29 AM

## 2018-12-25 NOTE — Progress Notes (Signed)
Remote pacemaker transmission.   

## 2018-12-28 NOTE — Telephone Encounter (Signed)
Spoke with Janett Billow at Dr. Brennan Bailey office. Procedure will be done in-office. Janett Billow will speak with NP at their office and request that she call us back on Monday for programming recommendations during procedure. Gave direct DC phone number for return call.  Pt was not PPM-dependent at wound check appointment in 06/2018, implanted for Mobitz II HB. As of most recent transmission on 12/17/18, VP 99.97%.

## 2018-12-31 NOTE — Telephone Encounter (Signed)
Yvonne Parker w/ Dr. Brennan Bailey office is calling back. She stated that they do not have a magnet and would like to know how she would get one.

## 2019-01-02 NOTE — Telephone Encounter (Signed)
[name] for Dr. Lenon Curt office called again. She wants to know if the pt will need a magnet. I told her she may need to call industry. I told her the nurse states she will give her a call back. She wanted to know will that be today because the surgery is tomorrow. I told her the nurse said she will give her a call back later today. Best number for [name] is [phone].

## 2019-01-02 NOTE — Telephone Encounter (Signed)
Yvonne Parker started another note.  I am merging the 2 notes together as they are regarding same question  [name] for Dr. Lenon Curt office called again. She wants to know if the pt will need a magnet. I told her she may need to call industry. I told her the nurse states she will give her a call back. She wanted to know will that be today because the surgery is tomorrow. I told her the nurse said she will give her a call back later today. Best number for [name] is [phone].

## 2019-01-02 NOTE — Telephone Encounter (Signed)
This encounter was created in error - please disregard.

## 2019-01-02 NOTE — Telephone Encounter (Signed)
[name] for Dr. Lenon Curt office called again. She wants to know if the pt will need a magnet. I told her she may need to call industry. I told her the nurse states she will give her a call back. She wanted to know will that be today because the surgery is tomorrow. I told her the nurse said she will give her a call back later today. Best number for [name] is [phone].

## 2019-01-02 NOTE — Telephone Encounter (Signed)
Per Tomi Bamberger, [employer] representative, magnet was dropped off at Dr. Brennan Bailey office today.

## 2019-01-03 NOTE — Telephone Encounter (Signed)
LMOVM on nurse line with direct number if they still have any questions. Encounter closed.

## 2019-02-04 ENCOUNTER — Telehealth: Payer: Self-pay | Admitting: Internal Medicine

## 2019-02-04 NOTE — Telephone Encounter (Signed)
Spoke with patient. Determined that she is referring to reprogramming that was considered at wound check due to oversensing of FFRWs (detecting as false AF episodes). Explained that issue has resolved itself as lead healed and stabilized, reprogramming is not necessary at this time. Pt verbalizes understanding.  Pt reached out-of-pocket deductible for the year. Requesting recommendation from Dr. Caryl Comes if he still recommends obtaining PET scan. Advised I will seek clarification and call her back later this week. Pt in agreement with plan, no further questions at this time.

## 2019-02-04 NOTE — Telephone Encounter (Signed)
New Message  Patient is calling in to see if she was supposed to come in and have her pacemaker calibrated. Please give patient a call pack to confirm.

## 2019-02-04 NOTE — Telephone Encounter (Signed)
The pt was calling about having her ppm calibrated. I let her talk to Raquel Sarna, [place].

## 2019-02-06 NOTE — Telephone Encounter (Signed)
Spoke with patient, made her aware that Dr. Caryl Comes is reviewing whether PET scan is still indicated. Pt verbalizes understanding, aware she will receive a call from [place], [place], with an update about plan in the next few weeks. No further questions at this time.

## 2019-02-15 ENCOUNTER — Telehealth: Payer: Self-pay | Admitting: Internal Medicine

## 2019-02-15 NOTE — Telephone Encounter (Signed)
Patient called stating she received call from nurse advising her Dr. Caryl Parker wanted her to get a PET scan done and to call [facility] to get it done.  She called [facility] and they told her we need to call to set that up for her because we need to fill out paperwork for it.  Patient would like a call back from the nurse.

## 2019-02-19 NOTE — Telephone Encounter (Signed)
Information has been re-sent to Yvonne Parker in PET. Pt is aware they will contact her to schedule.   She had no additional questions.

## 2019-03-11 ENCOUNTER — Telehealth: Payer: Self-pay | Admitting: Internal Medicine

## 2019-03-11 NOTE — Telephone Encounter (Signed)
Lm for Yvonne Parker from PET department [phone].

## 2019-03-11 NOTE — Telephone Encounter (Signed)
I spoke to the patient and informed her that I would look into the PET Scan appointment.  She verbalized understanding.

## 2019-03-11 NOTE — Telephone Encounter (Signed)
Patient states she was told [facility] would call her to set up a PET scan, no one has call her yet to schedule that.

## 2019-03-12 NOTE — Telephone Encounter (Signed)
I spoke with Ulice Dash from [place] and he said that he never received anything from [place] on 10/20 regarding PET scan from Dr Caryl Comes.

## 2019-03-15 ENCOUNTER — Telehealth: Payer: Self-pay

## 2019-03-15 NOTE — Telephone Encounter (Signed)
Left message for Yvonne Parker from [facility] that we have re-faxed PET Scan request on patient.

## 2019-03-18 ENCOUNTER — Telehealth: Payer: Self-pay

## 2019-03-18 ENCOUNTER — Ambulatory Visit (INDEPENDENT_AMBULATORY_CARE_PROVIDER_SITE_OTHER): Payer: 59 | Admitting: *Deleted

## 2019-03-18 DIAGNOSIS — I441 Atrioventricular block, second degree: Secondary | ICD-10-CM | POA: Diagnosis not present

## 2019-03-18 NOTE — Telephone Encounter (Signed)
I spoke to the patient and informed her that Yvonne Parker from [place] received the packet for PET Scan and it is scheduled on 12/17.  He will call patient with further advisement.

## 2019-03-19 LAB — CUP PACEART REMOTE DEVICE CHECK
Battery Remaining Longevity: 145 mo
Battery Voltage: 3.06 V
Brady Statistic AP VP Percent: 19.1 %
Brady Statistic AP VS Percent: 0.03 %
Brady Statistic AS VP Percent: 80.7 %
Brady Statistic AS VS Percent: 0.18 %
Brady Statistic RA Percent Paced: 19.15 %
Brady Statistic RV Percent Paced: 99.8 %
Date Time Interrogation Session: 20201116043812
Implantable Lead Implant Date: 20200213
Implantable Lead Implant Date: 20200213
Implantable Lead Location: 753859
Implantable Lead Location: 753860
Implantable Lead Model: 5076
Implantable Lead Model: 5076
Implantable Pulse Generator Implant Date: 20200213
Lead Channel Impedance Value: 323 Ohm
Lead Channel Impedance Value: 361 Ohm
Lead Channel Impedance Value: 380 Ohm
Lead Channel Impedance Value: 437 Ohm
Lead Channel Pacing Threshold Amplitude: 0.5 V
Lead Channel Pacing Threshold Amplitude: 0.875 V
Lead Channel Pacing Threshold Pulse Width: 0.4 ms
Lead Channel Pacing Threshold Pulse Width: 0.4 ms
Lead Channel Sensing Intrinsic Amplitude: 17.875 mV
Lead Channel Sensing Intrinsic Amplitude: 17.875 mV
Lead Channel Sensing Intrinsic Amplitude: 2.125 mV
Lead Channel Sensing Intrinsic Amplitude: 2.125 mV
Lead Channel Setting Pacing Amplitude: 1.5 V
Lead Channel Setting Pacing Amplitude: 2.5 V
Lead Channel Setting Pacing Pulse Width: 0.4 ms
Lead Channel Setting Sensing Sensitivity: 2 mV

## 2019-04-04 ENCOUNTER — Telehealth: Payer: Self-pay | Admitting: Internal Medicine

## 2019-04-04 NOTE — Telephone Encounter (Signed)
Per pt she has not heard from [place] regarding scheduling her scan. I have left a VM for Jay at [facility] to discuss. Pt understands I will contact her back when I know more.

## 2019-04-04 NOTE — Telephone Encounter (Signed)
New Message:  Patient has not heard anything from [place] about her PET scan. She is getting concerned. She would like to know if Dr.Klein has any information or even a number where she can rach out to [facility] herself.  The patient mentioned December 17th as a date, but nothing has been mentioned after that date.

## 2019-04-04 NOTE — Telephone Encounter (Signed)
Spoke with Ulice Dash from [place]. Pt is scheduled for her scan on Dec 17. [facility] will be contacting her on Dec 14th to discuss pre procedure instructions.   Pt was called and made aware. She had no additional questions at this time.

## 2019-04-05 NOTE — Telephone Encounter (Signed)
Will hold off on imaging for now  How is she doing  Thanks SK

## 2019-04-06 NOTE — Telephone Encounter (Signed)
We can try and get it done Its done at [facility]

## 2019-04-10 NOTE — Telephone Encounter (Signed)
Yvonne Parker from [facility] is calling stating they need our office to get preauthorization in order to schedule & preform the procedure. Please Advise.

## 2019-04-11 NOTE — Telephone Encounter (Signed)
Yvonne Parker from [place] [facility] called to discuss the pre authorization further. This pt does not need a pre auth. No additional needs.

## 2019-04-11 NOTE — Telephone Encounter (Signed)
Per Cherlyn Cushing, pt does not require pre authorization and she needs no further assistance

## 2019-04-11 NOTE — Telephone Encounter (Signed)
Follow Up  Baxter Flattery from [place] is calling to speak with Yvonne Parker in regards to patient. Please give Baxter Flattery a call back.

## 2019-04-12 NOTE — Progress Notes (Signed)
Remote pacemaker transmission.   

## 2019-06-17 ENCOUNTER — Ambulatory Visit (INDEPENDENT_AMBULATORY_CARE_PROVIDER_SITE_OTHER): Payer: 59 | Admitting: *Deleted

## 2019-06-17 DIAGNOSIS — I441 Atrioventricular block, second degree: Secondary | ICD-10-CM

## 2019-06-17 LAB — CUP PACEART REMOTE DEVICE CHECK
Battery Remaining Longevity: 137 mo
Battery Voltage: 3.03 V
Brady Statistic AP VP Percent: 17.35 %
Brady Statistic AP VS Percent: 0.04 %
Brady Statistic AS VP Percent: 82.33 %
Brady Statistic AS VS Percent: 0.28 %
Brady Statistic RA Percent Paced: 17.39 %
Brady Statistic RV Percent Paced: 99.68 %
Date Time Interrogation Session: 20210215050717
Implantable Lead Implant Date: 20200213
Implantable Lead Implant Date: 20200213
Implantable Lead Location: 753859
Implantable Lead Location: 753860
Implantable Lead Model: 5076
Implantable Lead Model: 5076
Implantable Pulse Generator Implant Date: 20200213
Lead Channel Impedance Value: 304 Ohm
Lead Channel Impedance Value: 342 Ohm
Lead Channel Impedance Value: 361 Ohm
Lead Channel Impedance Value: 437 Ohm
Lead Channel Pacing Threshold Amplitude: 0.5 V
Lead Channel Pacing Threshold Amplitude: 0.875 V
Lead Channel Pacing Threshold Pulse Width: 0.4 ms
Lead Channel Pacing Threshold Pulse Width: 0.4 ms
Lead Channel Sensing Intrinsic Amplitude: 17.875 mV
Lead Channel Sensing Intrinsic Amplitude: 17.875 mV
Lead Channel Sensing Intrinsic Amplitude: 2.875 mV
Lead Channel Sensing Intrinsic Amplitude: 2.875 mV
Lead Channel Setting Pacing Amplitude: 1.5 V
Lead Channel Setting Pacing Amplitude: 2.5 V
Lead Channel Setting Pacing Pulse Width: 0.4 ms
Lead Channel Setting Sensing Sensitivity: 2 mV

## 2019-06-18 NOTE — Progress Notes (Signed)
PPM Remote  

## 2019-06-19 ENCOUNTER — Telehealth: Payer: Self-pay | Admitting: Internal Medicine

## 2019-06-19 NOTE — Telephone Encounter (Signed)
Patient calling for PET scan results she had done at [facility] last year.

## 2019-06-21 NOTE — Telephone Encounter (Signed)
Attempted phone call to pt.  Left voicemail message to contact RN at [phone]. 

## 2019-06-21 NOTE — Telephone Encounter (Signed)
Pt returning phone call.  Pt advised once Dr Caryl Comes reviews scan and forwards result to RN, pt will be notified.  Pt verbalizes understanding and agrees with current plan.

## 2019-06-23 NOTE — Telephone Encounter (Signed)
   Smitty Cords  could you do me a big favor and look at this [facility] PET report-- this was one of the patients we talked about last summer, with clinical story consistent with sarcoid, cMRI neg and so for PET Thanks SK

## 2019-07-24 ENCOUNTER — Telehealth: Payer: Self-pay | Admitting: Internal Medicine

## 2019-07-24 NOTE — Telephone Encounter (Signed)
  Patient is calling regarding results of her PET scan. She states no one has called her about her test results that was done in December.

## 2019-08-10 NOTE — Telephone Encounter (Signed)
Called and spoke with the patient regarding the PET scan and the issues of hypermetabolic activity -- Reveiwed yday with [place] and recommended, because of the decision regarding potentially long term meds including prednisone or methotrexate, that expert input to guide decisions and followup would be beneficial  She is agreeable and I will reach out to Dr Burt Knack at [facility] about arranging a consultation for her  Yvonne Parker Thanks SK

## 2019-09-23 ENCOUNTER — Ambulatory Visit (INDEPENDENT_AMBULATORY_CARE_PROVIDER_SITE_OTHER): Payer: 59 | Admitting: *Deleted

## 2019-09-23 DIAGNOSIS — I441 Atrioventricular block, second degree: Secondary | ICD-10-CM | POA: Diagnosis not present

## 2019-09-23 LAB — CUP PACEART REMOTE DEVICE CHECK
Battery Remaining Longevity: 132 mo
Battery Voltage: 3.01 V
Brady Statistic AP VP Percent: 21.77 %
Brady Statistic AP VS Percent: 0.05 %
Brady Statistic AS VP Percent: 78.01 %
Brady Statistic AS VS Percent: 0.17 %
Brady Statistic RA Percent Paced: 21.84 %
Brady Statistic RV Percent Paced: 99.78 %
Date Time Interrogation Session: 20210523165805
Implantable Lead Implant Date: 20200213
Implantable Lead Implant Date: 20200213
Implantable Lead Location: 753859
Implantable Lead Location: 753860
Implantable Lead Model: 5076
Implantable Lead Model: 5076
Implantable Pulse Generator Implant Date: 20200213
Lead Channel Impedance Value: 323 Ohm
Lead Channel Impedance Value: 361 Ohm
Lead Channel Impedance Value: 380 Ohm
Lead Channel Impedance Value: 456 Ohm
Lead Channel Pacing Threshold Amplitude: 0.5 V
Lead Channel Pacing Threshold Amplitude: 0.875 V
Lead Channel Pacing Threshold Pulse Width: 0.4 ms
Lead Channel Pacing Threshold Pulse Width: 0.4 ms
Lead Channel Sensing Intrinsic Amplitude: 14 mV
Lead Channel Sensing Intrinsic Amplitude: 14 mV
Lead Channel Sensing Intrinsic Amplitude: 3.375 mV
Lead Channel Sensing Intrinsic Amplitude: 3.375 mV
Lead Channel Setting Pacing Amplitude: 1.5 V
Lead Channel Setting Pacing Amplitude: 2.5 V
Lead Channel Setting Pacing Pulse Width: 0.4 ms
Lead Channel Setting Sensing Sensitivity: 2 mV

## 2019-09-24 NOTE — Progress Notes (Signed)
Remote pacemaker transmission.   

## 2019-10-11 ENCOUNTER — Telehealth: Payer: Self-pay | Admitting: Internal Medicine

## 2019-10-11 NOTE — Telephone Encounter (Signed)
Spoke with Yvonne Parker and advised Dr Caryl Comes is current ly out of office but will forward question to him.  Yvonne Parker verbalized understanding and agrees with current plan

## 2019-10-11 NOTE — Telephone Encounter (Signed)
New message   Patient is calling to see if he had talked to a specialist about inflammation from PET Scan. Please advise.

## 2019-11-29 NOTE — Telephone Encounter (Signed)
Patient was calling to follow up about a specialist for the patient to see in [place]. She states Dr. Caryl Comes was going to recommend someone for her, but she has not heard anything from Dr. Caryl Comes yet.

## 2019-12-23 ENCOUNTER — Ambulatory Visit (INDEPENDENT_AMBULATORY_CARE_PROVIDER_SITE_OTHER): Payer: 59 | Admitting: *Deleted

## 2019-12-23 DIAGNOSIS — I441 Atrioventricular block, second degree: Secondary | ICD-10-CM | POA: Diagnosis not present

## 2019-12-24 LAB — CUP PACEART REMOTE DEVICE CHECK
Battery Remaining Longevity: 125 mo
Battery Voltage: 3 V
Brady Statistic AP VP Percent: 20.69 %
Brady Statistic AP VS Percent: 0.06 %
Brady Statistic AS VP Percent: 79.09 %
Brady Statistic AS VS Percent: 0.16 %
Brady Statistic RA Percent Paced: 20.76 %
Brady Statistic RV Percent Paced: 99.78 %
Date Time Interrogation Session: 20210822191052
Implantable Lead Implant Date: 20200213
Implantable Lead Implant Date: 20200213
Implantable Lead Location: 753859
Implantable Lead Location: 753860
Implantable Lead Model: 5076
Implantable Lead Model: 5076
Implantable Pulse Generator Implant Date: 20200213
Lead Channel Impedance Value: 304 Ohm
Lead Channel Impedance Value: 342 Ohm
Lead Channel Impedance Value: 380 Ohm
Lead Channel Impedance Value: 437 Ohm
Lead Channel Pacing Threshold Amplitude: 0.5 V
Lead Channel Pacing Threshold Amplitude: 1 V
Lead Channel Pacing Threshold Pulse Width: 0.4 ms
Lead Channel Pacing Threshold Pulse Width: 0.4 ms
Lead Channel Sensing Intrinsic Amplitude: 14.125 mV
Lead Channel Sensing Intrinsic Amplitude: 14.125 mV
Lead Channel Sensing Intrinsic Amplitude: 3.25 mV
Lead Channel Sensing Intrinsic Amplitude: 3.25 mV
Lead Channel Setting Pacing Amplitude: 1.5 V
Lead Channel Setting Pacing Amplitude: 2.5 V
Lead Channel Setting Pacing Pulse Width: 0.4 ms
Lead Channel Setting Sensing Sensitivity: 2 mV

## 2019-12-30 NOTE — Progress Notes (Signed)
Remote pacemaker transmission.   

## 2020-01-02 ENCOUNTER — Telehealth (INDEPENDENT_AMBULATORY_CARE_PROVIDER_SITE_OTHER): Payer: 59 | Admitting: Internal Medicine

## 2020-01-02 ENCOUNTER — Other Ambulatory Visit: Payer: Self-pay

## 2020-01-02 VITALS — Ht 69.0 in | Wt 150.0 lb

## 2020-01-02 DIAGNOSIS — Z950 Presence of cardiac pacemaker: Secondary | ICD-10-CM

## 2020-01-02 DIAGNOSIS — I452 Bifascicular block: Secondary | ICD-10-CM

## 2020-01-02 DIAGNOSIS — R0602 Shortness of breath: Secondary | ICD-10-CM

## 2020-01-02 DIAGNOSIS — I441 Atrioventricular block, second degree: Secondary | ICD-10-CM | POA: Diagnosis not present

## 2020-01-02 NOTE — Progress Notes (Signed)
Electrophysiology TeleHealth Note   Due to national recommendations of social distancing due to COVID 19, an audio/video telehealth visit is felt to be most appropriate for this patient at this time.  See MyChart message from today for the patient's consent to telehealth for [facility].   Date:  01/02/2020   ID:  [name], DOB 07/15/1960, MRN [id number]  Location: patient's home  Provider location: [address] [place]  Evaluation Performed: Follow-up visit  PCP:  Deland Pretty, MD  Cardiologist:     Electrophysiologist:  SK   Chief Complaint:  Heart block   History of Present Illness:    Yvonne Parker is a 59 y.o. female who presents via audio/video conferencing for a telehealth visit today.  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 After discussions with [facility], there was the identification of a subset of patients who had abnormal PET with sarcoid and normal cMRI.  She was referred to [facility] for PET scanning accomplished 12/20 Evidence of mild active myocardial inflammation in the basal anterior and lateral walls and mid- anterolateral segment, also involving the papillary muscle, the patient reports doing ok   Notes wLH with bending  Fatigue; exercise intolerance-- doing home chores has to stop and catch her breath No chest pain  Lexapro x 1 yr w much improved   The patient denies symptoms of fevers, chills, cough, or new SOB worrisome for COVID 19.    Past Medical History:  Diagnosis Date   Migraines     Past Surgical History:  Procedure Laterality Date   OVARIAN CYST REMOVAL     PACEMAKER IMPLANT N/A 06/14/2018   Procedure: PACEMAKER IMPLANT;  Surgeon: Evans Lance, MD;  Location: [place];  Service: Cardiovascular;  Laterality: N/A;   TRANSTHORACIC ECHOCARDIOGRAM  06/2008    EF=>55%, mobile IAS; mild MR; trace TR; mild pulm valve regurg    Current Outpatient Medications  Medication Sig Dispense Refill   escitalopram (LEXAPRO) 10 MG tablet Take 1 tablet by mouth daily. Patient is taking 1/2 tablet for 2 days, then going to a full tablet     levothyroxine (SYNTHROID, LEVOTHROID) 50 MCG tablet Take 50 mcg by mouth daily.      No current facility-administered medications for this visit.    Allergies:   Penicillins and Sulfa antibiotics   Social History:  The patient  reports that she has never smoked. She has never used smokeless tobacco. She reports that she does not drink alcohol and does not use drugs.   Family History:  The patient's   family history includes Cancer in her maternal grandfather and mother.   ROS:  Please see the history of present illness.   All other systems are personally reviewed and negative.    Exam:    Vital Signs:  Ht 5\' 9"  (1.753 m)    Wt 150 lb (68 kg)    LMP 10/05/2011    BMI 22.15 kg/m     Well appearing, alert and conversant, regular work of breathing,  good skin color Eyes- anicteric, neuro- grossly intact, skin- no apparent rash or lesions or cyanosis, mouth- oral mucosa is pink   Labs/Other Tests and Data Reviewed:    Recent Labs: No results found for requested labs within last 8760 hours.   Wt Readings from Last 3 Encounters:  01/02/20  150 lb (68 kg)  09/13/18 145 lb (65.8 kg)  07/02/18 151 lb 6.4 oz (68.7 kg)     Other studies personally reviewed: Additional studies/ records that were reviewed today include:As above *  ECG 2/20 QRSd 152 msec  ASSESSMENT & PLAN:   Complete heart block   Pacemaker   Abnormal PET  Dizziness with bending  Dyspnea/fatigue with exertion    Device function normal with good HR excursion; 99% V pacing   RVlead placement was apical which raises the possibility of her DOE 2/2 pacemaker cardiomyopathy so will check echo  Will also get CPX  And then will need to further  assess the issue of cardiac sarcoid.  The abnormal PET may be a marker, and will try and arrange offline consultation with one of the [facility] sarcoid people to ask whether repeat PET scanning would be appropriate and what might be the threshold for initiating immunosuppressive therapy  Not sure why she is dizzy with bending without symptoms of LH otherwise  Has Sleep disordered breathing and so will ask her to followup with her PCP regarding a sleep study as this might also be contributing to her fatigue    COVID 19 screen The patient denies symptoms of COVID 19 at this time.  The importance of social distancing was discussed today.  Follow-up:  31m  Next remote: As Scheduled   Current medicines are reviewed at length with the patient today.   The patient does not have concerns regarding her medicines.  The following changes were made today:  none  Labs/ tests ordered today include: CPX and echo for dyspnea  No orders of the defined types were placed in this encounter.    Patient Risk:  after full review of this patients clinical status, I feel that they are at moderate risk at this time.  Today, I have spent  20  minutes with the patient with telehealth technology discussing the above.  Signed, Virl Axe, MD  01/02/2020 1:50 PM     [place] [address] [phone] (office) [phone] (fax)

## 2020-01-02 NOTE — Patient Instructions (Addendum)
LVM with pt regarding the below recommendations:  Medication Instructions:  None ordered.  Labwork: None ordered.  Testing/Procedures: Your physician has recommended that you have a cardiopulmonary stress test (CPX). CPX testing is a non-invasive measurement of heart and lung function. It replaces a traditional treadmill stress test. This type of test provides a tremendous amount of information that relates not only to your present condition but also for future outcomes. This test combines measurements of you ventilation, respiratory gas exchange in the lungs, electrocardiogram (EKG), blood pressure and physical response before, during, and following an exercise protocol.  Your physician has requested that you have an echocardiogram. Echocardiography is a painless test that uses sound waves to create images of your heart. It provides your doctor with information about the size and shape of your heart and how well your heart's chambers and valves are working. This procedure takes approximately one hour. There are no restrictions for this procedure.   Follow-Up: Your physician recommends that you schedule a follow-up appointment in:   3 months with Dr. Caryl Comes  Any Other Special Instructions Will Be Listed Below (If Applicable).     If you need a refill on your cardiac medications before your next appointment, please call your pharmacy.

## 2020-01-21 NOTE — Telephone Encounter (Signed)
See 01/02/2020 encounter for details.

## 2020-01-27 ENCOUNTER — Other Ambulatory Visit: Payer: Self-pay

## 2020-01-27 ENCOUNTER — Ambulatory Visit (HOSPITAL_COMMUNITY): Payer: 59

## 2020-01-27 ENCOUNTER — Ambulatory Visit (HOSPITAL_COMMUNITY)
Admission: RE | Admit: 2020-01-27 | Discharge: 2020-01-27 | Disposition: A | Discharge status: Home / Self Care | Payer: 59 | Source: Ambulatory Visit | Attending: Internal Medicine | Admitting: Internal Medicine

## 2020-01-27 DIAGNOSIS — R0602 Shortness of breath: Secondary | ICD-10-CM

## 2020-01-27 DIAGNOSIS — I081 Rheumatic disorders of both mitral and tricuspid valves: Secondary | ICD-10-CM | POA: Insufficient documentation

## 2020-01-27 DIAGNOSIS — Z950 Presence of cardiac pacemaker: Secondary | ICD-10-CM | POA: Insufficient documentation

## 2020-01-27 DIAGNOSIS — I4510 Unspecified right bundle-branch block: Secondary | ICD-10-CM | POA: Insufficient documentation

## 2020-01-27 LAB — ECHOCARDIOGRAM COMPLETE: S' Lateral: 3 cm

## 2020-01-27 NOTE — Telephone Encounter (Signed)
Dr Burt Knack called back last Friday and we discussed the issue of the cMRI/PET discordance-- given the time interlude, he has advised repeat PET scanning and he would be willing to see her and arrange repeat PET  I have spoken w the pt and she is agreeable so will forward her contact information to Dr Ruby Cola [facility]

## 2020-01-27 NOTE — Progress Notes (Signed)
Echocardiogram 2D Echocardiogram has been performed.  Oneal Deputy Deeann Servidio 01/27/2020, 1:34 PM

## 2020-02-07 ENCOUNTER — Telehealth: Payer: Self-pay

## 2020-02-07 NOTE — Telephone Encounter (Signed)
Spoke with pt and advised  Per Dr Caryl Comes echo shows normal heart muscle function.  Pt verbalizes understanding and thanked Therapist, sports for call.

## 2020-02-07 NOTE — Telephone Encounter (Signed)
-----   Message from Deboraha Sprang, MD sent at 01/28/2020 10:04 PM EDT ----- Please Inform Patient Echo showed  norma heart muscle function

## 2020-02-23 IMAGING — MR MR CARD MORPHOLOGY WO/W CM
10 of 12 series · 38 of 40 positions shown · IV contrast (gadavist)
Comparison: none

CLINICAL DATA: 58-year-old female with 2. AVB.

EXAM:
CARDIAC MRI
TECHNIQUE: The patient was scanned on a 1.5 Tesla GE magnet. A dedicated
cardiac coil was used. Functional imaging was done using Fiesta
sequences. [DATE], and 4 chamber views were done to assess for RWMA's.
Modified Unlu rule using a short axis stack was used to
calculate an ejection fraction on a dedicated work station using
Circle software. The patient received 7cc of Gadavist. After 10
minutes inversion recovery sequences were used to assess for
infiltration and scar tissue.
CONTRAST:  7 cc  of Gadavist

[Series 7: bSSFP · oblique · 8.0mm · 1.34mm/px · 14 of 400 slices shown (1 of 5)]
[im 1/400]
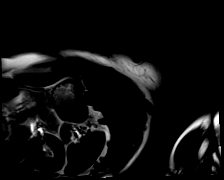
[im 31/400]
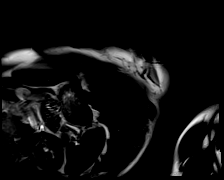
[im 62/400]
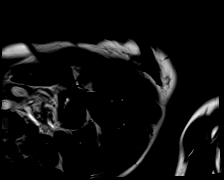
[im 93/400]
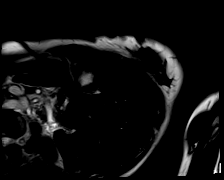
[im 123/400]
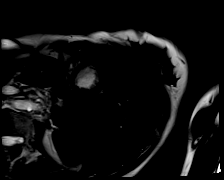
[im 154/400]
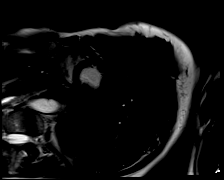
[im 185/400]
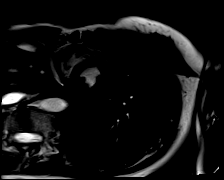
[im 215/400]
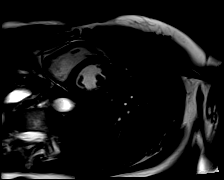
[im 246/400]
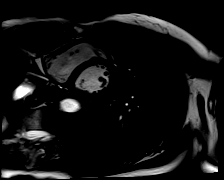
[im 277/400]
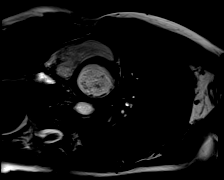
[im 307/400]
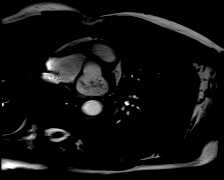
[im 338/400]
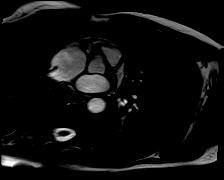
[im 369/400]
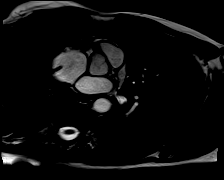
[im 400/400]
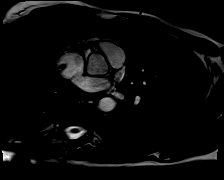

[Series 12: bSSFP · coronal · 6.0mm · 1.17mm/px · 1 of 25 slices shown (2 of 5)]
[im 1/25]
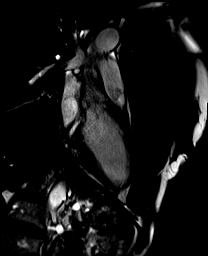

[Series 13: bSSFP · coronal · 6.0mm · 1.17mm/px · 1 of 25 slices shown (3 of 5)]
[im 1/25]
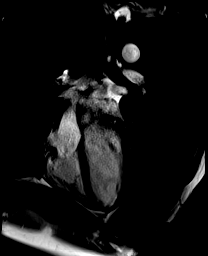

[Series 14: bSSFP · oblique · 6.0mm · 1.17mm/px · 1 of 25 slices shown (4 of 5)]
[im 1/25]
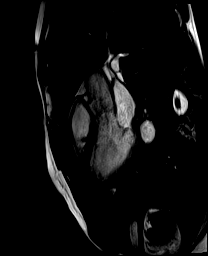

[Series 15: t1_tse_db axial · axial · 6.0mm · 1.18mm/px · 1 of 18 slices shown]
[im 1/18]
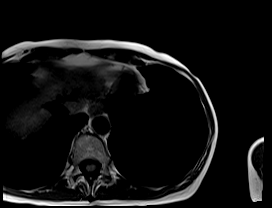

[Series 16: t2_stir_db axial · axial · 6.0mm · 1.35mm/px · 1 of 18 slices shown]
[im 1/18]
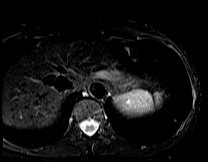

[Series 17: bSSFP · axial · 6.0mm · 1.61mm/px · z∈[-146,-44]mm · 16 of 450 slices shown (5 of 5)]
[im 1/450]
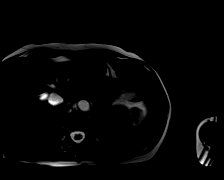
[im 30/450]
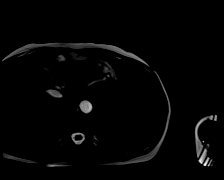
[im 60/450]
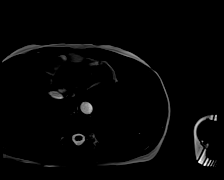
[im 90/450]
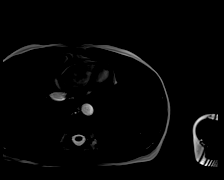
[im 120/450]
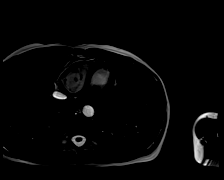
[im 150/450]
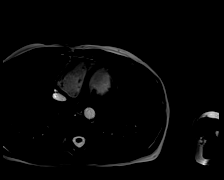
[im 180/450]
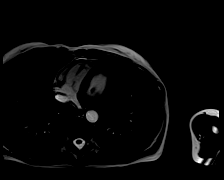
[im 210/450]
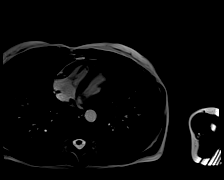
[im 240/450]
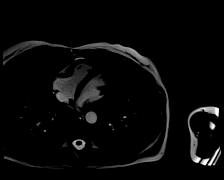
[im 270/450]
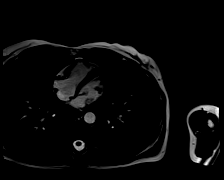
[im 300/450]
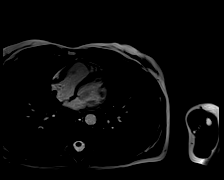
[im 330/450]
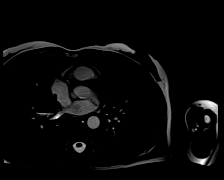
[im 360/450]
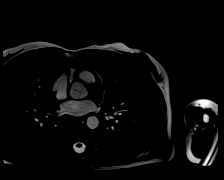
[im 390/450]
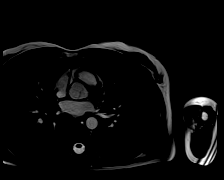
[im 420/450]
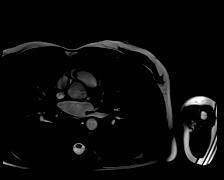
[im 450/450]
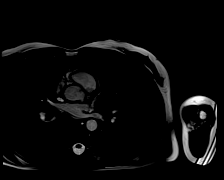

[Series 18: t1_tse_db sag · sagittal · 5.0mm · 1.03mm/px · 1 of 20 slices shown]
[im 1/20]
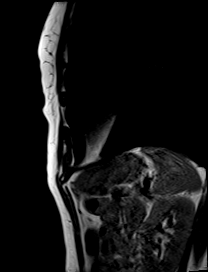

[Series 22: lge_single shot sa · oblique · 8.0mm · 1.46mm/px · 1 of 16 slices shown (1 of 2)]
[im 1/16]
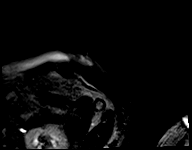

[Series 23: lge_single shot sa · oblique · 8.0mm · 1.46mm/px · 1 of 16 slices shown (2 of 2)]
[im 1/16]
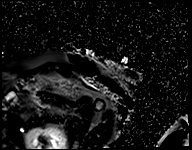

[38 of 40 positions shown; findings below may reference images not displayed]

FINDINGS: 1. Normal left ventricular size, thickness and systolic function
(LVEF =60%). There are no regional wall motion abnormalities.

There is no late gadolinium enhancement in the left ventricular
myocardium.

LVEDD: 42 mm

LVESD: 25 mm

LVEDV: 119 ml

LVESV: 48 ml

SV: 72 ml

CO: 4.7 L/min

Myocardial mass: 106 g

2. Normal right ventricular size, thickness and systolic function
(LVEF = 68%). There are no regional wall motion abnormalities.

3.  Normal left and right atrial size.

4. Normal size of the aortic root, ascending aorta and pulmonary
artery.

5.  Trivial mitral and tricuspid regurgitation.

6.  Normal pericardium.  No pericardial effusion.
IMPRESSION: 1. Normal left ventricular size, thickness and systolic function
(LVEF =60%). There are no regional wall motion abnormalities.

There is no late gadolinium enhancement in the left ventricular
myocardium.

2. Normal right ventricular size, thickness and systolic function
(LVEF = 68%). There are no regional wall motion abnormalities.

3.  Normal left and right atrial size.

4. Normal size of the aortic root, ascending aorta and pulmonary
artery.

5.  Trivial mitral and tricuspid regurgitation.

There is no evidence for a infiltrative of inflammatory
cardiomyopathy (such as sarcoidosis).

## 2020-02-25 IMAGING — CR DG CHEST 2V
2 series · 2 of 2 positions shown · non-contrast
Comparison: 09/23/2015

CLINICAL DATA: Pacemaker placement

EXAM:
CHEST - 2 VIEW

[chest pa]
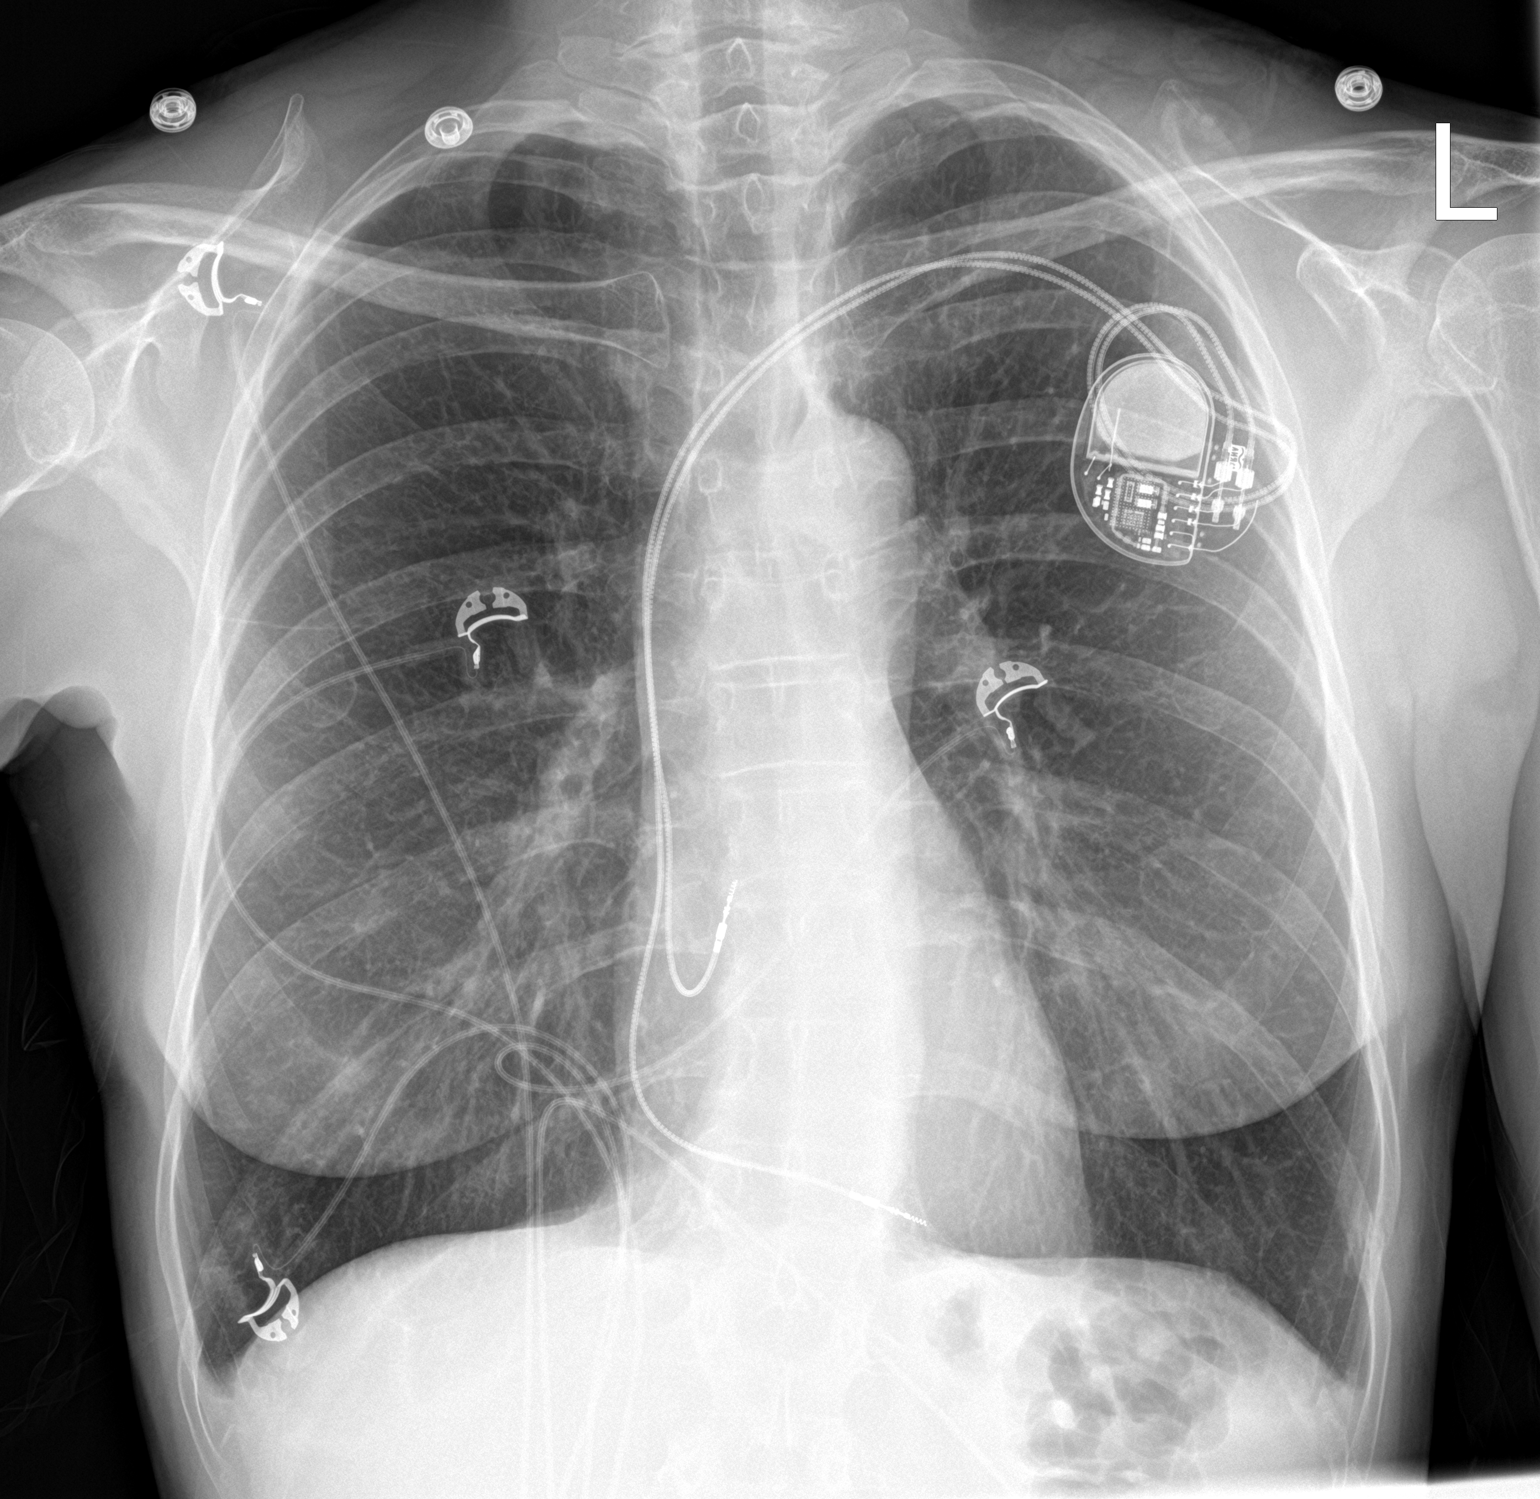

[chest lat]
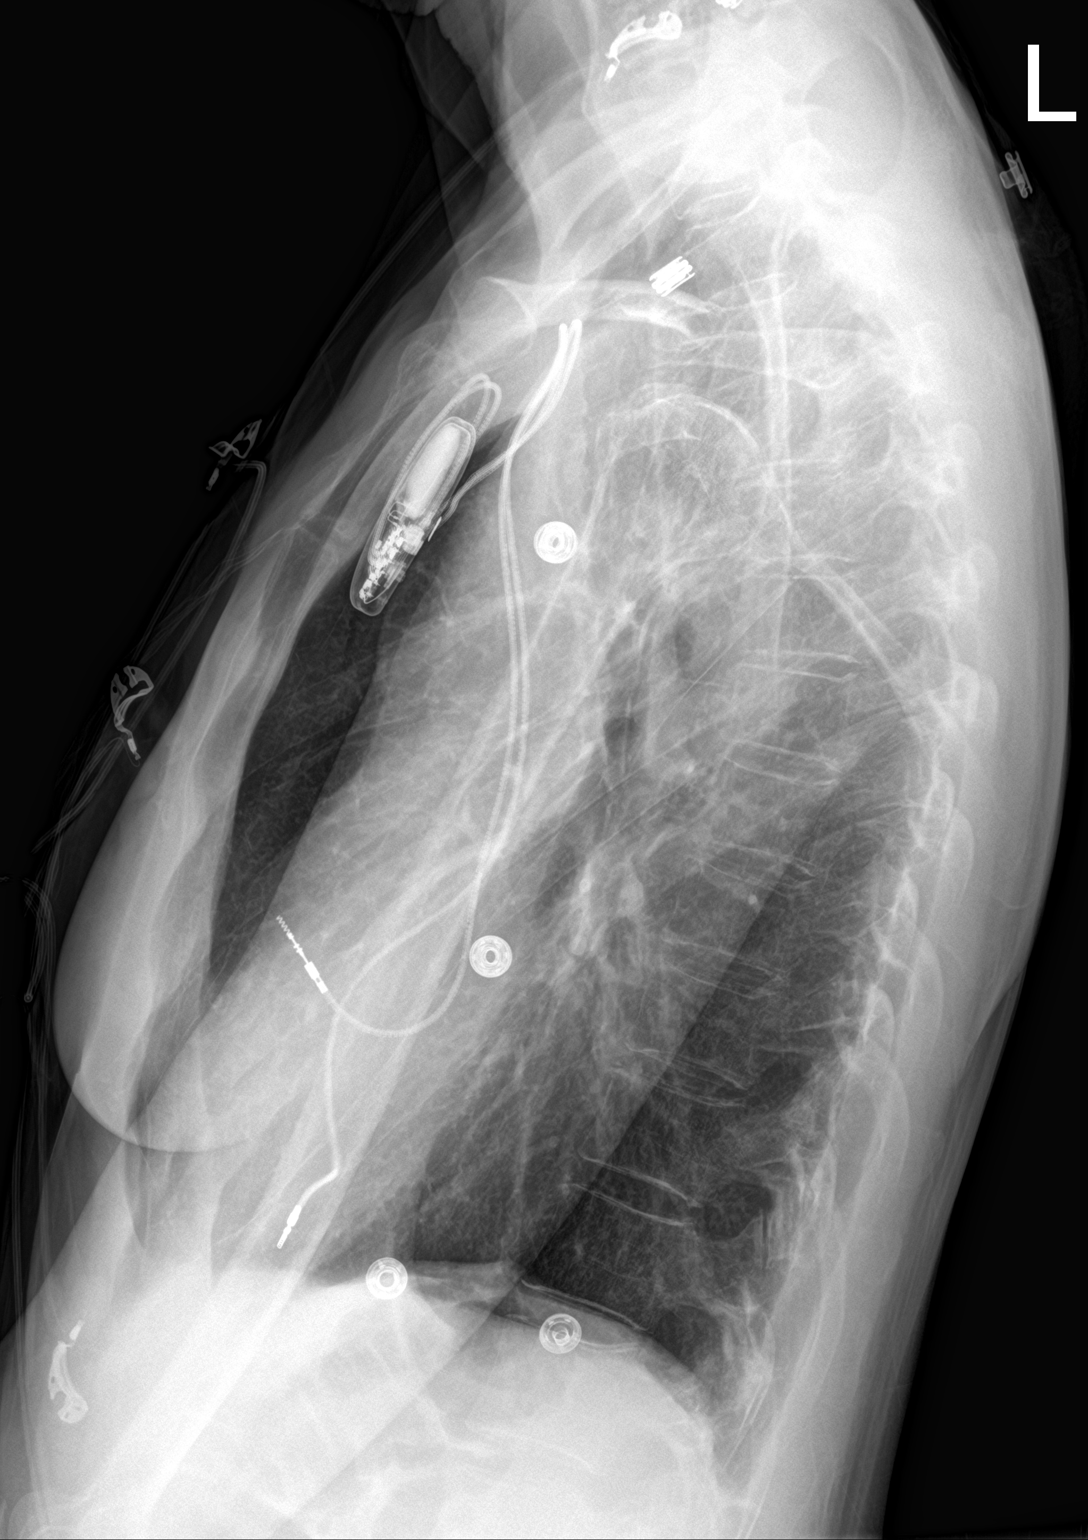

[2 of 2 positions shown; findings below may reference images not displayed]

FINDINGS: Dual-chamber pacer with leads over the right atrial appendage and
right ventricle. Normal heart size and mediastinal contours. There
is no edema, consolidation, effusion, or pneumothorax. Healing
lateral left rib fractures.
IMPRESSION: Dual-chamber pacer implant without complicating feature.

## 2020-03-05 ENCOUNTER — Telehealth: Payer: Self-pay

## 2020-03-05 NOTE — Telephone Encounter (Signed)
Attempted phone call to pt and  Left voicemail message to contact RN at [phone].

## 2020-03-05 NOTE — Telephone Encounter (Signed)
-----   Message from Deboraha Sprang, MD sent at 02/25/2020  7:28 PM EDT ----- Please Inform Patient that CPX is abnormal  demonstrating a limit to HR increase with exercise which may be supported by the last device interrogation We should have her come and activate rate response and have her walk the stairs a few times and see if we can improve exercise capacity   Thanks

## 2020-03-06 NOTE — Telephone Encounter (Signed)
Attempted phone call and left voicemail message to contact RN at [phone].

## 2020-03-09 NOTE — Telephone Encounter (Signed)
-----   Message from Drake Leach, RN sent at 03/04/2020  4:29 PM EDT ----- Regarding: RE: appt Should be done in clinic with EP AP or Dr Caryl Comes. They will nee to assess to determine programming changes. Thanks ,  Jenny Reichmann ----- Message ----- From: Thora Lance, RN Sent: 03/04/2020   3:58 PM EDT To: Drake Leach, RN Subject: appt                                           Is this something we can schedule in device clinic or his clinic?  Thanks,  Rosann Auerbach ----- Message ----- From: Deboraha Sprang, MD Sent: 02/25/2020   7:28 PM EDT To: Emily Filbert, RN, Thora Lance, RN  Please Inform Patient that CPX is abnormal  demonstrating a limit to HR increase with exercise which may be supported by the last device interrogation We should have her come and activate rate response and have her walk the stairs a few times and see if we can improve exercise capacity   Thanks

## 2020-03-09 NOTE — Telephone Encounter (Signed)
Spoke with pt who states Dr Burt Knack at [facility] clinic made some adjustments to her PPM at her visit there 01/2020.  Pt states she has felt so much better since the changes and has even been able to run.  Pt advised will forward information to Dr Caryl Comes.  Pt's next appointment with Dr Caryl Comes is due 05/2020 and home remote check is scheduled for 03/23/2020.  Pt thanked Therapist, sports for the call.

## 2020-03-23 ENCOUNTER — Ambulatory Visit (INDEPENDENT_AMBULATORY_CARE_PROVIDER_SITE_OTHER): Payer: 59

## 2020-03-23 DIAGNOSIS — I441 Atrioventricular block, second degree: Secondary | ICD-10-CM | POA: Diagnosis not present

## 2020-03-23 LAB — CUP PACEART REMOTE DEVICE CHECK
Battery Remaining Longevity: 134 mo
Battery Voltage: 3 V
Brady Statistic AP VP Percent: 27.88 %
Brady Statistic AP VS Percent: 0.01 %
Brady Statistic AS VP Percent: 71.98 %
Brady Statistic AS VS Percent: 0.14 %
Brady Statistic RA Percent Paced: 27.85 %
Brady Statistic RV Percent Paced: 99.86 %
Date Time Interrogation Session: 20211121180211
Implantable Lead Implant Date: 20200213
Implantable Lead Implant Date: 20200213
Implantable Lead Location: 753859
Implantable Lead Location: 753860
Implantable Lead Model: 5076
Implantable Lead Model: 5076
Implantable Pulse Generator Implant Date: 20200213
Lead Channel Impedance Value: 304 Ohm
Lead Channel Impedance Value: 361 Ohm
Lead Channel Impedance Value: 380 Ohm
Lead Channel Impedance Value: 456 Ohm
Lead Channel Pacing Threshold Amplitude: 0.5 V
Lead Channel Pacing Threshold Amplitude: 1 V
Lead Channel Pacing Threshold Pulse Width: 0.4 ms
Lead Channel Pacing Threshold Pulse Width: 0.4 ms
Lead Channel Sensing Intrinsic Amplitude: 14 mV
Lead Channel Sensing Intrinsic Amplitude: 14 mV
Lead Channel Sensing Intrinsic Amplitude: 2.25 mV
Lead Channel Sensing Intrinsic Amplitude: 2.25 mV
Lead Channel Setting Pacing Amplitude: 1.5 V
Lead Channel Setting Pacing Amplitude: 2 V
Lead Channel Setting Pacing Pulse Width: 0.4 ms
Lead Channel Setting Sensing Sensitivity: 2 mV

## 2020-03-30 NOTE — Progress Notes (Signed)
Remote pacemaker transmission.   

## 2020-04-04 ENCOUNTER — Ambulatory Visit: Payer: 59

## 2020-06-22 ENCOUNTER — Ambulatory Visit (INDEPENDENT_AMBULATORY_CARE_PROVIDER_SITE_OTHER): Payer: 59

## 2020-06-22 DIAGNOSIS — I441 Atrioventricular block, second degree: Secondary | ICD-10-CM

## 2020-06-24 LAB — CUP PACEART REMOTE DEVICE CHECK
Battery Remaining Longevity: 129 mo
Battery Voltage: 2.99 V
Brady Statistic AP VP Percent: 28.97 %
Brady Statistic AP VS Percent: 0.02 %
Brady Statistic AS VP Percent: 70.35 %
Brady Statistic AS VS Percent: 0.65 %
Brady Statistic RA Percent Paced: 28.94 %
Brady Statistic RV Percent Paced: 99.32 %
Date Time Interrogation Session: 20220221060524
Implantable Lead Implant Date: 20200213
Implantable Lead Implant Date: 20200213
Implantable Lead Location: 753859
Implantable Lead Location: 753860
Implantable Lead Model: 5076
Implantable Lead Model: 5076
Implantable Pulse Generator Implant Date: 20200213
Lead Channel Impedance Value: 285 Ohm
Lead Channel Impedance Value: 342 Ohm
Lead Channel Impedance Value: 361 Ohm
Lead Channel Impedance Value: 437 Ohm
Lead Channel Pacing Threshold Amplitude: 0.625 V
Lead Channel Pacing Threshold Amplitude: 1 V
Lead Channel Pacing Threshold Pulse Width: 0.4 ms
Lead Channel Pacing Threshold Pulse Width: 0.4 ms
Lead Channel Sensing Intrinsic Amplitude: 13.25 mV
Lead Channel Sensing Intrinsic Amplitude: 13.25 mV
Lead Channel Sensing Intrinsic Amplitude: 2.125 mV
Lead Channel Sensing Intrinsic Amplitude: 2.125 mV
Lead Channel Setting Pacing Amplitude: 1.5 V
Lead Channel Setting Pacing Amplitude: 2 V
Lead Channel Setting Pacing Pulse Width: 0.4 ms
Lead Channel Setting Sensing Sensitivity: 2 mV

## 2020-06-26 NOTE — Progress Notes (Signed)
Remote pacemaker transmission.   

## 2020-08-21 ENCOUNTER — Ambulatory Visit (INDEPENDENT_AMBULATORY_CARE_PROVIDER_SITE_OTHER): Payer: 59 | Admitting: Physician Assistant

## 2020-08-21 ENCOUNTER — Encounter: Payer: Self-pay | Admitting: Physician Assistant

## 2020-08-21 ENCOUNTER — Other Ambulatory Visit: Payer: Self-pay

## 2020-08-21 VITALS — BP 116/80 | HR 85 | Ht 69.0 in | Wt 146.4 lb

## 2020-08-21 DIAGNOSIS — D8685 Sarcoid myocarditis: Secondary | ICD-10-CM

## 2020-08-21 DIAGNOSIS — I441 Atrioventricular block, second degree: Secondary | ICD-10-CM | POA: Diagnosis not present

## 2020-08-21 DIAGNOSIS — Z950 Presence of cardiac pacemaker: Secondary | ICD-10-CM

## 2020-08-21 LAB — CUP PACEART INCLINIC DEVICE CHECK
Battery Remaining Longevity: 130 mo
Battery Voltage: 2.99 V
Brady Statistic AP VP Percent: 28.99 %
Brady Statistic AP VS Percent: 0.02 %
Brady Statistic AS VP Percent: 70.58 %
Brady Statistic AS VS Percent: 0.41 %
Brady Statistic RA Percent Paced: 28.97 %
Brady Statistic RV Percent Paced: 99.57 %
Date Time Interrogation Session: 20220422161404
Implantable Lead Implant Date: 20200213
Implantable Lead Implant Date: 20200213
Implantable Lead Location: 753859
Implantable Lead Location: 753860
Implantable Lead Model: 5076
Implantable Lead Model: 5076
Implantable Pulse Generator Implant Date: 20200213
Lead Channel Impedance Value: 361 Ohm
Lead Channel Impedance Value: 399 Ohm
Lead Channel Impedance Value: 399 Ohm
Lead Channel Impedance Value: 513 Ohm
Lead Channel Pacing Threshold Amplitude: 0.625 V
Lead Channel Pacing Threshold Amplitude: 1 V
Lead Channel Pacing Threshold Pulse Width: 0.4 ms
Lead Channel Pacing Threshold Pulse Width: 0.4 ms
Lead Channel Sensing Intrinsic Amplitude: 1.875 mV
Lead Channel Sensing Intrinsic Amplitude: 13.25 mV
Lead Channel Sensing Intrinsic Amplitude: 13.25 mV
Lead Channel Sensing Intrinsic Amplitude: 2.5 mV
Lead Channel Setting Pacing Amplitude: 1.5 V
Lead Channel Setting Pacing Amplitude: 2 V
Lead Channel Setting Pacing Pulse Width: 0.4 ms
Lead Channel Setting Sensing Sensitivity: 2 mV

## 2020-08-21 NOTE — Progress Notes (Signed)
Cardiology Office Note Date:  08/21/2020  Patient ID:  Yvonne, Parker [birth year], MRN [id number] PCP:  Deland Pretty, MD  Cardiologist:  Dr. Debara Pickett Electrophysiologist: Dr. Caryl Comes Dr. Sampson Si, [place] [facility]    Chief Complaint: planned fllow up  History of Present Illness: Yvonne Parker is a 60 y.o. female with history of hypothyroidism, PVCs, symptomatic bradycardia/advacned conduction system disease  W/PPM, AT  She comes in today to be seen for Dr. Caryl Comes, last seen by him via tele health 01/02/2020, she c/o SOB, lightheaded with bending, and planned to check an echo for possible RV pacing CM and CPX and referral to [facility]  Recommended sleep study  She was referred to Dr. Burt Knack at [facility] for further investigation into possible sarcoidosis as cause for early advanced heart block. She was seen 02/10/21 impression was that she had idiopathic heart block at a relatively young age with no evidence of ischemia, though recommended that we get tick-borne illness serology, ANA, and repeat PET scan. Because the area of increased FDG uptake was not matched with the perfusion defect per report and her MRI had no areas of delayed enhancement I am not convinced that this was a true positive finding. I have asked the original images be sent here from [facility] and [facility]  She had a tele health visit 03/20/21 Her PET scan showed no decrease in perfusion but a modest increase in FDG at the base of the anterior wall. She is completely asymptomatic and active with no symptoms of chest pain, dyspnea on exertion, PND, orthopnea, palpitations or syncope. She has a pacemaker and since reprogramming here has felt much more energy. 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 Her device interrogation on October 12th demonstrated that the 109 episodes of atrial tachycardia since February 27th were largely related to atrial events in the refractory  period the AV delay was decreased. She had no ventricular arrhythmias. Rate responsiveness was turned on with rate responsive AV delay. The upper tracking and sensor rates were sent to 150 beats per minute did not think that the potential side effects of immunosuppressive therapy are clearly outweighed by the potential benefits of event medication. They discussed data from several meta analyses of patients who have cardiac sarcoid with no delayed enhancement on MRI, no right ventricular involvement on PET scan, and the [facility] data which all support an event rate of 0.6-2.0% annually. I recommended repeat echocardiogram and Holter monitor in 6 months   Telephone notes Nov report feeling much improved after device programming changes made (above).  TODAY She is doing great. Very active with her family, trail walks, hikes etc with no exertional intolerances. She will at times feel a bit lightheaded when standing quickly No no dizziness otherwise, no near syncope or syncope No CP, palpitations or cardiac awareness   Device information MDT dual chamber PPM implanted 06/14/2018   Past Medical History:  Diagnosis Date  . Migraines     Past Surgical History:  Procedure Laterality Date  . OVARIAN CYST REMOVAL    . PACEMAKER IMPLANT N/A 06/14/2018   Procedure: PACEMAKER IMPLANT;  Surgeon: Evans Lance, MD;  Location: [facility];  Service: Cardiovascular;  Laterality: N/A;  . TRANSTHORACIC ECHOCARDIOGRAM  06/2008   EF=>55%, mobile IAS; mild MR; trace TR; mild pulm valve regurg    Current Outpatient Medications  Medication Sig Dispense Refill  . escitalopram (LEXAPRO) 10 MG tablet Take 1 tablet by mouth daily.  Patient is taking 1/2 tablet for 2 days, then going to a full tablet    . levothyroxine (SYNTHROID, LEVOTHROID) 50 MCG tablet Take 50 mcg by mouth daily.      No current facility-administered medications for this visit.    Allergies:   Penicillins and Sulfa antibiotics    Social History:  The patient  reports that she has never smoked. She has never used smokeless tobacco. She reports that she does not drink alcohol and does not use drugs.   Family History:  The patient's family history includes Cancer in her maternal grandfather and mother.  ROS:  Please see the history of present illness.    All other systems are reviewed and otherwise negative.   PHYSICAL EXAM:  VS:  BP 116/80   Pulse 85   Ht 5\' 9"  (1.753 m)   Wt 146 lb 6.4 oz (66.4 kg)   LMP 10/05/2011   SpO2 99%   BMI 21.62 kg/m  BMI: Body mass index is 21.62 kg/m. Well nourished, well developed, in no acute distress HEENT: normocephalic, atraumatic Neck: no JVD, carotid bruits or masses Cardiac:  RRR; no significant murmurs, no rubs, or gallops Lungs:  CTA b/l, no wheezing, rhonchi or rales Abd: soft, nontender MS: no deformity or atrophy Ext: no edema Skin: warm and dry, no rash Neuro:  No gross deficits appreciated Psych: euthymic mood, full affect  PPM site is stable, no tethering or discomfort   EKG:  Done today and reviewed by myself shows  SR, V paced 85bpm  Device interrogation done today and reviewed by myself:  battery and lead measurements are good On AT/AF episode  Noted: markers only, suspect FF, not true  arrhythmia    01/27/2020: CPX Attending: Functional capacity overall mildly reduced compared to sedentary norms. That said tohere is a significant cardiac limitation with a markedly elevated VE/VCO2 slope suggestive of elevated pulmonary pressures during exercise and marked chronotropic incompetence in setting of reported AV pacing (full ECG strips not attached to this file). Would consider adjustment of pacer settings and possible retesting.    01/27/2020 TTE IMPRESSIONS  1. Abnormal septal motion . Left ventricular ejection fraction, by  estimation, is 50 to 55%. The left ventricle has low normal function. The  left ventricle has no regional wall motion  abnormalities. Left ventricular  diastolic parameters were normal.  2. Pacing wires in RA/RV . Right ventricular systolic function is normal.  The right ventricular size is normal. There is normal pulmonary artery  systolic pressure.  3. The mitral valve is normal in structure. Mild mitral valve  regurgitation. No evidence of mitral stenosis.  4. The aortic valve is normal in structure. Aortic valve regurgitation is  not visualized. No aortic stenosis is present.  5. The inferior vena cava is normal in size with greater than 50%  respiratory variability, suggesting right atrial pressure of 3 mmHg.     Recent Labs: No results found for requested labs within last 8760 hours.  No results found for requested labs within last 8760 hours.   CrCl cannot be calculated (Patient's most recent lab result is older than the maximum 21 days allowed.).   Wt Readings from Last 3 Encounters:  08/21/20 146 lb 6.4 oz (66.4 kg)  01/02/20 150 lb (68 kg)  09/13/18 145 lb (65.8 kg)     Other studies reviewed: Additional studies/records reviewed today include: summarized above  ASSESSMENT AND PLAN:  1. Advanced heart block w/PPM     Intact function  She is pacer dependent today   2. 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  The patient did not have any concerns regarding medicines.  Venetia Night, PA-C 08/21/2020 4:03 PM     [address] [place] [address] [phone] (office)  [phone] (fax)

## 2020-08-21 NOTE — Patient Instructions (Signed)
Medication Instructions:  Your physician recommends that you continue on your current medications as directed. Please refer to the Current Medication list given to you today.  *If you need a refill on your cardiac medications before your next appointment, please call your pharmacy*   Lab Work: NONE ORDERED  TODAY  If you have labs (blood work) drawn today and your tests are completely normal, you will receive your results only by: . MyChart Message (if you have MyChart) OR . A paper copy in the mail If you have any lab test that is abnormal or we need to change your treatment, we will call you to review the results.   Testing/Procedures: NONE ORDERED  TODAY   Follow-Up: At [facility], you and your health needs are our priority.  As part of our continuing mission to provide you with exceptional heart care, we have created designated Provider Care Teams.  These Care Teams include your primary Cardiologist (physician) and Advanced Practice Providers (APPs -  Physician Assistants and Nurse Practitioners) who all work together to provide you with the care you need, when you need it.  We recommend signing up for the patient portal called "MyChart".  Sign up information is provided on this After Visit Summary.  MyChart is used to connect with patients for Virtual Visits (Telemedicine).  Patients are able to view lab/test results, encounter notes, upcoming appointments, etc.  Non-urgent messages can be sent to your provider as well.   To learn more about what you can do with MyChart, go to [email or web address].    Your next appointment:   1 year(s)  The format for your next appointment:   In Person  Provider:   You may see Dr. Klein  or one of the following Advanced Practice Providers on your designated Care Team:    Amber Seiler, NP  Renee Ursuy, PA-C  Michael "Andy" Tillery, PA-C    Other Instructions   

## 2020-09-21 ENCOUNTER — Ambulatory Visit (INDEPENDENT_AMBULATORY_CARE_PROVIDER_SITE_OTHER): Payer: 59

## 2020-09-21 DIAGNOSIS — I441 Atrioventricular block, second degree: Secondary | ICD-10-CM

## 2020-09-22 LAB — CUP PACEART REMOTE DEVICE CHECK
Battery Remaining Longevity: 113 mo
Battery Voltage: 2.99 V
Brady Statistic AP VP Percent: 22.08 %
Brady Statistic AP VS Percent: 0.04 %
Brady Statistic AS VP Percent: 76.08 %
Brady Statistic AS VS Percent: 1.8 %
Brady Statistic RA Percent Paced: 22.54 %
Brady Statistic RV Percent Paced: 98.16 %
Date Time Interrogation Session: 20220523013942
Implantable Lead Implant Date: 20200213
Implantable Lead Implant Date: 20200213
Implantable Lead Location: 753859
Implantable Lead Location: 753860
Implantable Lead Model: 5076
Implantable Lead Model: 5076
Implantable Pulse Generator Implant Date: 20200213
Lead Channel Impedance Value: 304 Ohm
Lead Channel Impedance Value: 342 Ohm
Lead Channel Impedance Value: 380 Ohm
Lead Channel Impedance Value: 437 Ohm
Lead Channel Pacing Threshold Amplitude: 0.625 V
Lead Channel Pacing Threshold Amplitude: 1.25 V
Lead Channel Pacing Threshold Pulse Width: 0.4 ms
Lead Channel Pacing Threshold Pulse Width: 0.4 ms
Lead Channel Sensing Intrinsic Amplitude: 13.25 mV
Lead Channel Sensing Intrinsic Amplitude: 13.25 mV
Lead Channel Sensing Intrinsic Amplitude: 2 mV
Lead Channel Sensing Intrinsic Amplitude: 2 mV
Lead Channel Setting Pacing Amplitude: 1.5 V
Lead Channel Setting Pacing Amplitude: 2.5 V
Lead Channel Setting Pacing Pulse Width: 0.4 ms
Lead Channel Setting Sensing Sensitivity: 2 mV

## 2020-10-12 NOTE — Progress Notes (Signed)
Remote pacemaker transmission.   

## 2020-12-21 ENCOUNTER — Ambulatory Visit (INDEPENDENT_AMBULATORY_CARE_PROVIDER_SITE_OTHER): Payer: 59

## 2020-12-21 DIAGNOSIS — I441 Atrioventricular block, second degree: Secondary | ICD-10-CM

## 2020-12-22 LAB — CUP PACEART REMOTE DEVICE CHECK
Battery Remaining Longevity: 114 mo
Battery Voltage: 2.99 V
Brady Statistic AP VP Percent: 28.86 %
Brady Statistic AP VS Percent: 0.05 %
Brady Statistic AS VP Percent: 68.38 %
Brady Statistic AS VS Percent: 2.71 %
Brady Statistic RA Percent Paced: 29.6 %
Brady Statistic RV Percent Paced: 97.24 %
Date Time Interrogation Session: 20220821190611
Implantable Lead Implant Date: 20200213
Implantable Lead Implant Date: 20200213
Implantable Lead Location: 753859
Implantable Lead Location: 753860
Implantable Lead Model: 5076
Implantable Lead Model: 5076
Implantable Pulse Generator Implant Date: 20200213
Lead Channel Impedance Value: 323 Ohm
Lead Channel Impedance Value: 361 Ohm
Lead Channel Impedance Value: 380 Ohm
Lead Channel Impedance Value: 456 Ohm
Lead Channel Pacing Threshold Amplitude: 0.625 V
Lead Channel Pacing Threshold Amplitude: 1.125 V
Lead Channel Pacing Threshold Pulse Width: 0.4 ms
Lead Channel Pacing Threshold Pulse Width: 0.4 ms
Lead Channel Sensing Intrinsic Amplitude: 10.5 mV
Lead Channel Sensing Intrinsic Amplitude: 10.5 mV
Lead Channel Sensing Intrinsic Amplitude: 2.5 mV
Lead Channel Sensing Intrinsic Amplitude: 2.5 mV
Lead Channel Setting Pacing Amplitude: 1.5 V
Lead Channel Setting Pacing Amplitude: 2.25 V
Lead Channel Setting Pacing Pulse Width: 0.4 ms
Lead Channel Setting Sensing Sensitivity: 2 mV

## 2021-01-07 NOTE — Progress Notes (Signed)
Remote pacemaker transmission.   

## 2021-03-04 ENCOUNTER — Ambulatory Visit (INDEPENDENT_AMBULATORY_CARE_PROVIDER_SITE_OTHER): Payer: 59 | Admitting: Internal Medicine

## 2021-03-04 ENCOUNTER — Encounter: Payer: Self-pay | Admitting: Internal Medicine

## 2021-03-04 ENCOUNTER — Other Ambulatory Visit: Payer: Self-pay

## 2021-03-04 VITALS — BP 132/92 | HR 86 | Temp 97.8°F | Ht 68.5 in | Wt 151.0 lb

## 2021-03-04 DIAGNOSIS — R9389 Abnormal findings on diagnostic imaging of other specified body structures: Secondary | ICD-10-CM | POA: Diagnosis not present

## 2021-03-04 DIAGNOSIS — R0982 Postnasal drip: Secondary | ICD-10-CM

## 2021-03-04 NOTE — Patient Instructions (Signed)
You can try an anti-histamine as needed for your cough and drainage.

## 2021-03-04 NOTE — Progress Notes (Signed)
Yvonne Parker    [id number]    1960/08/30  Primary Care Physician:Pharr, Thayer Jew, MD  Referring Physician: Holland Commons, [place] [facility] [place],  [place] [id number] Reason for Consultation: abnormal chest xray Date of Consultation: 03/04/2021  Chief complaint:   Chief Complaint  Patient presents with   abnormal chest xray      HPI: Yvonne Parker is a 60 y.o. woman with history of PPM for heart block. She presents for new patient evaluation of abnormal chest xray.  She had tenderness in the mid left back pain after lifting a couch. Had a chest xray with Dr. Shelia Media which showed a change from previous. This was about 2 months ago. The pain is now resolved.   Also has a tickling cough in her throat. Does have post nasal drainage and frequent throat clearing. This cough is once a day, doesn't bother her too much or affect QOL.   No prior lung disease. She does have a deviated septum.  No asthma, does have seasonal environmental allergies.  She denies recurrent bronchitis, pneumonia or URIs.   No family history of asthma.   She says 30 years ago someone did a chest xray and told her she had emphysema and had to see a pulmonary specialist at that time. Nothing further was done and she was given reassurance.    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  Smoking history: never smoker, no passive smoke exposure  Social History   Occupational History   Not on file  Tobacco Use   Smoking status: Never   Smokeless tobacco: Never  Substance and Sexual Activity   Alcohol use: No   Drug use: No   Sexual activity: Not on file    Relevant family history:  Family History  Problem Relation Age of Onset   Cancer Mother    Cancer Maternal Grandfather    Lung disease Neg Hx     Past Medical History:  Diagnosis Date   Heart  block     Past Surgical History:  Procedure Laterality Date   OVARIAN CYST REMOVAL     PACEMAKER IMPLANT N/A 06/14/2018   Procedure: PACEMAKER IMPLANT;  Surgeon: Evans Lance, MD;  Location: [facility];  Service: Cardiovascular;  Laterality: N/A;   TRANSTHORACIC ECHOCARDIOGRAM  06/2008   EF=>55%, mobile IAS; mild MR; trace TR; mild pulm valve regurg     Physical Exam: Blood pressure (!) 132/92, pulse 86, temperature 97.8 F (36.6 C), temperature source Oral, height 5' 8.5" (1.74 m), weight 151 lb (68.5 kg), last menstrual period 10/05/2011, SpO2 99 %. Gen:      No acute distress ENT:  mild nasal erythema and debris, septal deviation to the left, no nasal polyps, mucus membranes moist Lungs:    No increased respiratory effort, symmetric chest wall excursion, clear to auscultation bilaterally, no wheezes or crackles CV:         Regular rate and rhythm; no murmurs, rubs, or gallops.  No pedal edema Abd:      + bowel sounds; soft, non-tender; no distension MSK: no acute synovitis of DIP or PIP joints, no mechanics hands.  Skin:      Warm and dry; no rashes Neuro: normal speech, no focal facial asymmetry Psych: alert and oriented x3, normal mood and affect  Data Reviewed/Medical Decision Making:  Independent interpretation of tests: Imaging:  Review of patient's chest xrays images 2020 revealed hyperinflation, s/p PPM, no nodules or acute findings. Mild levoscoliosis. The patient's images have been independently reviewed by me.    PFTs:  No flowsheet data found.  Labs:  Lab Results  Component Value Date   WBC 4.0 06/14/2018   HGB 13.7 06/14/2018   HCT 40.5 06/14/2018   MCV 91.2 06/14/2018   PLT 177 06/14/2018   Lab Results  Component Value Date   NA 138 06/14/2018   K 3.8 06/14/2018   CL 106 06/14/2018   CO2 23 06/14/2018     Immunization status:  Immunization History  Administered Date(s) Administered   Influenza-Unspecified 03/02/2018     I reviewed  prior external note(s) from Cardiology, PCP Dr. Pennie Banter office  I reviewed the result(s) of the labs and imaging as noted above.   I have ordered PFTs   Assessment:  Yvonne Parker is a 60 y.o. woman who presents with:  Abnormal Chest xray Post nasal drainage Chest pain  Plan/Recommendations: Reviewed her notes and prior imaging. Suspect this hyperinflation is a normal variation. She does not have COPD. This can be seen in asthma as well but she denies any signs or symptoms of this.  No further follow up is needed for this.   For her Cough she can take an OTC antihistamine as needed, suspect related to environmental allergies.  Her chest pain is likely MSK and is resolved from recent back strain. No further follow up needed.   She has already gotten her flu shot this year.   Return to Care: Return if symptoms worsen or fail to improve.  Lenice Llamas, MD Pulmonary and [place]  CC: Adria Dill Leonia Reader, [place]

## 2021-03-22 ENCOUNTER — Ambulatory Visit (INDEPENDENT_AMBULATORY_CARE_PROVIDER_SITE_OTHER): Payer: 59

## 2021-03-22 DIAGNOSIS — I441 Atrioventricular block, second degree: Secondary | ICD-10-CM | POA: Diagnosis not present

## 2021-03-23 LAB — CUP PACEART REMOTE DEVICE CHECK
Battery Remaining Longevity: 110 mo
Battery Voltage: 2.98 V
Brady Statistic AP VP Percent: 20.39 %
Brady Statistic AP VS Percent: 0.05 %
Brady Statistic AS VP Percent: 75.87 %
Brady Statistic AS VS Percent: 3.69 %
Brady Statistic RA Percent Paced: 21.33 %
Brady Statistic RV Percent Paced: 96.26 %
Date Time Interrogation Session: 20221121052102
Implantable Lead Implant Date: 20200213
Implantable Lead Implant Date: 20200213
Implantable Lead Location: 753859
Implantable Lead Location: 753860
Implantable Lead Model: 5076
Implantable Lead Model: 5076
Implantable Pulse Generator Implant Date: 20200213
Lead Channel Impedance Value: 304 Ohm
Lead Channel Impedance Value: 342 Ohm
Lead Channel Impedance Value: 399 Ohm
Lead Channel Impedance Value: 437 Ohm
Lead Channel Pacing Threshold Amplitude: 0.625 V
Lead Channel Pacing Threshold Amplitude: 1.125 V
Lead Channel Pacing Threshold Pulse Width: 0.4 ms
Lead Channel Pacing Threshold Pulse Width: 0.4 ms
Lead Channel Sensing Intrinsic Amplitude: 2.875 mV
Lead Channel Sensing Intrinsic Amplitude: 2.875 mV
Lead Channel Sensing Intrinsic Amplitude: 4.75 mV
Lead Channel Sensing Intrinsic Amplitude: 4.75 mV
Lead Channel Setting Pacing Amplitude: 1.5 V
Lead Channel Setting Pacing Amplitude: 2.25 V
Lead Channel Setting Pacing Pulse Width: 0.4 ms
Lead Channel Setting Sensing Sensitivity: 2 mV

## 2021-04-01 NOTE — Progress Notes (Signed)
Remote pacemaker transmission.   

## 2021-06-21 ENCOUNTER — Ambulatory Visit (INDEPENDENT_AMBULATORY_CARE_PROVIDER_SITE_OTHER): Payer: 59

## 2021-06-21 DIAGNOSIS — I441 Atrioventricular block, second degree: Secondary | ICD-10-CM | POA: Diagnosis not present

## 2021-06-21 LAB — CUP PACEART REMOTE DEVICE CHECK
Battery Remaining Longevity: 105 mo
Battery Voltage: 2.98 V
Brady Statistic AP VP Percent: 20.83 %
Brady Statistic AP VS Percent: 0.05 %
Brady Statistic AS VP Percent: 75.63 %
Brady Statistic AS VS Percent: 3.48 %
Brady Statistic RA Percent Paced: 21.52 %
Brady Statistic RV Percent Paced: 96.46 %
Date Time Interrogation Session: 20230220031123
Implantable Lead Implant Date: 20200213
Implantable Lead Implant Date: 20200213
Implantable Lead Location: 753859
Implantable Lead Location: 753860
Implantable Lead Model: 5076
Implantable Lead Model: 5076
Implantable Pulse Generator Implant Date: 20200213
Lead Channel Impedance Value: 342 Ohm
Lead Channel Impedance Value: 342 Ohm
Lead Channel Impedance Value: 399 Ohm
Lead Channel Impedance Value: 456 Ohm
Lead Channel Pacing Threshold Amplitude: 0.625 V
Lead Channel Pacing Threshold Amplitude: 1 V
Lead Channel Pacing Threshold Pulse Width: 0.4 ms
Lead Channel Pacing Threshold Pulse Width: 0.4 ms
Lead Channel Sensing Intrinsic Amplitude: 11.75 mV
Lead Channel Sensing Intrinsic Amplitude: 11.75 mV
Lead Channel Sensing Intrinsic Amplitude: 2.5 mV
Lead Channel Sensing Intrinsic Amplitude: 2.5 mV
Lead Channel Setting Pacing Amplitude: 1.5 V
Lead Channel Setting Pacing Amplitude: 2.25 V
Lead Channel Setting Pacing Pulse Width: 0.4 ms
Lead Channel Setting Sensing Sensitivity: 2 mV

## 2021-06-24 NOTE — Progress Notes (Signed)
Remote pacemaker transmission.   

## 2021-09-20 ENCOUNTER — Ambulatory Visit (INDEPENDENT_AMBULATORY_CARE_PROVIDER_SITE_OTHER): Payer: 59

## 2021-09-20 DIAGNOSIS — I441 Atrioventricular block, second degree: Secondary | ICD-10-CM

## 2021-09-21 LAB — CUP PACEART REMOTE DEVICE CHECK
Battery Remaining Longevity: 107 mo
Battery Voltage: 2.98 V
Brady Statistic AP VP Percent: 20.13 %
Brady Statistic AP VS Percent: 0.05 %
Brady Statistic AS VP Percent: 76.68 %
Brady Statistic AS VS Percent: 3.14 %
Brady Statistic RA Percent Paced: 20.89 %
Brady Statistic RV Percent Paced: 96.81 %
Date Time Interrogation Session: 20230522050745
Implantable Lead Implant Date: 20200213
Implantable Lead Implant Date: 20200213
Implantable Lead Location: 753859
Implantable Lead Location: 753860
Implantable Lead Model: 5076
Implantable Lead Model: 5076
Implantable Pulse Generator Implant Date: 20200213
Lead Channel Impedance Value: 304 Ohm
Lead Channel Impedance Value: 323 Ohm
Lead Channel Impedance Value: 380 Ohm
Lead Channel Impedance Value: 418 Ohm
Lead Channel Pacing Threshold Amplitude: 0.625 V
Lead Channel Pacing Threshold Amplitude: 1 V
Lead Channel Pacing Threshold Pulse Width: 0.4 ms
Lead Channel Pacing Threshold Pulse Width: 0.4 ms
Lead Channel Sensing Intrinsic Amplitude: 1.875 mV
Lead Channel Sensing Intrinsic Amplitude: 1.875 mV
Lead Channel Sensing Intrinsic Amplitude: 11.75 mV
Lead Channel Sensing Intrinsic Amplitude: 11.75 mV
Lead Channel Setting Pacing Amplitude: 1.5 V
Lead Channel Setting Pacing Amplitude: 2 V
Lead Channel Setting Pacing Pulse Width: 0.4 ms
Lead Channel Setting Sensing Sensitivity: 2 mV

## 2021-10-04 ENCOUNTER — Other Ambulatory Visit: Payer: Self-pay | Admitting: Registered Nurse

## 2021-10-04 DIAGNOSIS — E063 Autoimmune thyroiditis: Secondary | ICD-10-CM

## 2021-10-06 NOTE — Progress Notes (Signed)
Remote pacemaker transmission.   

## 2021-10-18 ENCOUNTER — Ambulatory Visit
Admission: RE | Admit: 2021-10-18 | Discharge: 2021-10-18 | Disposition: A | Discharge status: Home / Self Care | Payer: 59 | Source: Ambulatory Visit | Attending: Registered Nurse | Admitting: Registered Nurse

## 2021-10-18 DIAGNOSIS — E063 Autoimmune thyroiditis: Secondary | ICD-10-CM

## 2021-12-20 ENCOUNTER — Ambulatory Visit (INDEPENDENT_AMBULATORY_CARE_PROVIDER_SITE_OTHER): Payer: 59

## 2021-12-20 DIAGNOSIS — I441 Atrioventricular block, second degree: Secondary | ICD-10-CM | POA: Diagnosis not present

## 2021-12-21 LAB — CUP PACEART REMOTE DEVICE CHECK
Battery Remaining Longevity: 98 mo
Battery Voltage: 2.97 V
Brady Statistic AP VP Percent: 20.06 %
Brady Statistic AP VS Percent: 0.09 %
Brady Statistic AS VP Percent: 76.23 %
Brady Statistic AS VS Percent: 3.62 %
Brady Statistic RA Percent Paced: 20.79 %
Brady Statistic RV Percent Paced: 96.29 %
Date Time Interrogation Session: 20230821021223
Implantable Lead Implant Date: 20200213
Implantable Lead Implant Date: 20200213
Implantable Lead Location: 753859
Implantable Lead Location: 753860
Implantable Lead Model: 5076
Implantable Lead Model: 5076
Implantable Pulse Generator Implant Date: 20200213
Lead Channel Impedance Value: 304 Ohm
Lead Channel Impedance Value: 323 Ohm
Lead Channel Impedance Value: 380 Ohm
Lead Channel Impedance Value: 418 Ohm
Lead Channel Pacing Threshold Amplitude: 0.625 V
Lead Channel Pacing Threshold Amplitude: 1 V
Lead Channel Pacing Threshold Pulse Width: 0.4 ms
Lead Channel Pacing Threshold Pulse Width: 0.4 ms
Lead Channel Sensing Intrinsic Amplitude: 2.375 mV
Lead Channel Sensing Intrinsic Amplitude: 2.375 mV
Lead Channel Sensing Intrinsic Amplitude: 8.375 mV
Lead Channel Sensing Intrinsic Amplitude: 8.375 mV
Lead Channel Setting Pacing Amplitude: 1.5 V
Lead Channel Setting Pacing Amplitude: 2 V
Lead Channel Setting Pacing Pulse Width: 0.4 ms
Lead Channel Setting Sensing Sensitivity: 2 mV

## 2022-01-17 NOTE — Progress Notes (Signed)
Remote pacemaker transmission.   

## 2022-03-21 ENCOUNTER — Ambulatory Visit (INDEPENDENT_AMBULATORY_CARE_PROVIDER_SITE_OTHER): Payer: 59

## 2022-03-21 DIAGNOSIS — I441 Atrioventricular block, second degree: Secondary | ICD-10-CM

## 2022-03-22 LAB — CUP PACEART REMOTE DEVICE CHECK
Battery Remaining Longevity: 85 mo
Battery Voltage: 2.97 V
Brady Statistic AP VP Percent: 15.93 %
Brady Statistic AP VS Percent: 0.05 %
Brady Statistic AS VP Percent: 80.03 %
Brady Statistic AS VS Percent: 3.99 %
Brady Statistic RA Percent Paced: 16.84 %
Brady Statistic RV Percent Paced: 95.96 %
Date Time Interrogation Session: 20231120005932
Implantable Lead Connection Status: 753985
Implantable Lead Connection Status: 753985
Implantable Lead Implant Date: 20200213
Implantable Lead Implant Date: 20200213
Implantable Lead Location: 753859
Implantable Lead Location: 753860
Implantable Lead Model: 5076
Implantable Lead Model: 5076
Implantable Pulse Generator Implant Date: 20200213
Lead Channel Impedance Value: 342 Ohm
Lead Channel Impedance Value: 342 Ohm
Lead Channel Impedance Value: 418 Ohm
Lead Channel Impedance Value: 456 Ohm
Lead Channel Pacing Threshold Amplitude: 0.625 V
Lead Channel Pacing Threshold Amplitude: 1.25 V
Lead Channel Pacing Threshold Pulse Width: 0.4 ms
Lead Channel Pacing Threshold Pulse Width: 0.4 ms
Lead Channel Sensing Intrinsic Amplitude: 3.125 mV
Lead Channel Sensing Intrinsic Amplitude: 3.125 mV
Lead Channel Sensing Intrinsic Amplitude: 9.875 mV
Lead Channel Sensing Intrinsic Amplitude: 9.875 mV
Lead Channel Setting Pacing Amplitude: 1.5 V
Lead Channel Setting Pacing Amplitude: 2.5 V
Lead Channel Setting Pacing Pulse Width: 0.4 ms
Lead Channel Setting Sensing Sensitivity: 2 mV
Zone Setting Status: 755011
Zone Setting Status: 755011

## 2022-04-05 DIAGNOSIS — D8685 Sarcoid myocarditis: Secondary | ICD-10-CM | POA: Insufficient documentation

## 2022-04-07 ENCOUNTER — Encounter: Payer: Self-pay | Admitting: Internal Medicine

## 2022-04-07 ENCOUNTER — Ambulatory Visit: Payer: 59 | Attending: Internal Medicine | Admitting: Internal Medicine

## 2022-04-07 VITALS — BP 156/84 | HR 79 | Ht 68.5 in | Wt 145.8 lb

## 2022-04-07 DIAGNOSIS — D8685 Sarcoid myocarditis: Secondary | ICD-10-CM

## 2022-04-07 DIAGNOSIS — Z950 Presence of cardiac pacemaker: Secondary | ICD-10-CM

## 2022-04-07 DIAGNOSIS — I425 Other restrictive cardiomyopathy: Secondary | ICD-10-CM

## 2022-04-07 DIAGNOSIS — I441 Atrioventricular block, second degree: Secondary | ICD-10-CM | POA: Diagnosis not present

## 2022-04-07 NOTE — Progress Notes (Signed)
Patient Care Team: Deland Pretty, MD as PCP - General (Internal Medicine) Debara Pickett Nadean Corwin, MD as PCP - Cardiology (Cardiology)   HPI  Yvonne Parker is a 61 y.o. female Seen in follow-up for heart block-advanced and PVCs.  The mechanism of her heart block was investigated with various modalities and multiple conversations as well as visits with Dr. Sampson Si at [facility].  There were issues related to the image review that is to say the perfusion/FDG uptake images were never indeed seen side-by-side by him.  Ultimately as she was asymptomatic it was elected to dismiss the PET data and in the absence of MRI LGE, that associated with a very low angle rate, 2 not undertake empiric and pressor therapy  Pacer dependent 4/22 No significant cardiovascular symptoms.  No limited breathing or palpitations no lightheadedness.  Does have disabling anxiety.  Was on Lexapro for couple years dating back to onset of her arrhythmia issues, came off but has struggled over recent months.  Dominated by work issues Records and Results Reviewed   Past Medical History:  Diagnosis Date   Heart block     Past Surgical History:  Procedure Laterality Date   OVARIAN CYST REMOVAL     PACEMAKER IMPLANT N/A 06/14/2018   Procedure: PACEMAKER IMPLANT;  Surgeon: Evans Lance, MD;  Location: [facility];  Service: Cardiovascular;  Laterality: N/A;   TRANSTHORACIC ECHOCARDIOGRAM  06/2008   EF=>55%, mobile IAS; mild MR; trace TR; mild pulm valve regurg    Current Meds  Medication Sig   CALCIUM PO Take by mouth. sometimes   levothyroxine (SYNTHROID) 75 MCG tablet Take 75 mcg by mouth every morning.   Multiple Vitamin (MULTIVITAMIN PO) Take by mouth. sometimes   VITAMIN D PO Take by mouth daily.    Allergies  Allergen Reactions   Penicillins Rash and Other (See Comments)    Rash Did it involve swelling of the face/tongue/throat, SOB, or low BP? No Did it involve sudden or  severe rash/hives, skin peeling, or any reaction on the inside of your mouth or nose? No Did you need to seek medical attention at a hospital or doctor's office? No When did it last happen?      A long time ago If all above answers are "NO", may proceed with cephalosporin use.    Sulfa Antibiotics Rash and Other (See Comments)    Body aches, feverish, red rash       Review of Systems negative except from HPI and PMH  Physical Exam BP (!) 156/84   Pulse 79   Ht 5' 8.5" (1.74 m)   Wt 145 lb 12.8 oz (66.1 kg)   LMP 10/05/2011   SpO2 99%   BMI 21.85 kg/m  Well developed and well nourished in no acute distress HENT normal E scleral and icterus clear Neck Supple JVP flat; carotids brisk and full Clear to ausculation Regular rate and rhythm, no murmurs gallops or rub Soft with active bowel sounds No clubbing cyanosis  Edema Alert and oriented, grossly normal motor and sensory function Skin Warm and Dry  ECG sinus at 79 with P synchronous pacing .  She is doing  CrCl cannot be calculated (Patient's most recent lab result is older than the maximum 21 days allowed.).   Assessment and  Plan  Complete heart block ? Sarcoid  Hypertension  Pacemaker-[employer]  Anxiety     Patient has complete heart block.  Presumed sarcoid although  the diagnostic testing was equivocal.  Not treated.  Will repeat the ultrasound.  Looking for evidence of worsening cardiomyopathy either intrinsic or pacemaker mediated.  I have also reached out to Dr. Burt Knack as to whether there is follow-up to be had done at [facility].  Blood pressure is elevated.  Will likely need therapy; we will defer until we see what comes up on her echo as this may more clearly inform appropriate choices.  As relates to anxiety, suggested she resume her Lexapro, also suggested she consider other changes that might be able to decrease the triggers which she thinks occur mostly in the workplace    Current medicines  are reviewed at length with the patient today .  The patient does not  have concerns regarding medicines.

## 2022-04-07 NOTE — Patient Instructions (Signed)
Medication Instructions:  Your physician recommends that you continue on your current medications as directed. Please refer to the Current Medication list given to you today.  *If you need a refill on your cardiac medications before your next appointment, please call your pharmacy*   Lab Work: None ordered.  If you have labs (blood work) drawn today and your tests are completely normal, you will receive your results only by: [place] (if you have MyChart) OR A paper copy in the mail If you have any lab test that is abnormal or we need to change your treatment, we will call you to review the results.   Testing/Procedures: Your physician has requested that you have an echocardiogram. Echocardiography is a painless test that uses sound waves to create images of your heart. It provides your doctor with information about the size and shape of your heart and how well your heart's chambers and valves are working. This procedure takes approximately one hour. There are no restrictions for this procedure. Please do NOT wear cologne, perfume, aftershave, or lotions (deodorant is allowed). Please arrive 15 minutes prior to your appointment time.    Follow-Up: At [facility], you and your health needs are our priority.  As part of our continuing mission to provide you with exceptional heart care, we have created designated Provider Care Teams.  These Care Teams include your primary Cardiologist (physician) and Advanced Practice Providers (APPs -  Physician Assistants and Nurse Practitioners) who all work together to provide you with the care you need, when you need it.  We recommend signing up for the patient portal called "MyChart".  Sign up information is provided on this After Visit Summary.  MyChart is used to connect with patients for Virtual Visits (Telemedicine).  Patients are able to view lab/test results, encounter notes, upcoming appointments, etc.  Non-urgent messages can be  sent to your provider as well.   To learn more about what you can do with MyChart, go to [email or web address].    Your next appointment:   12 months with Dr Caryl Comes  Please check your BP at home and send [place] the readings in a couple of weeks.  Important Information About Sugar

## 2022-04-11 ENCOUNTER — Telehealth: Payer: Self-pay | Admitting: Internal Medicine

## 2022-04-11 NOTE — Telephone Encounter (Signed)
Spoke with pt who states she had a few days after her appointment with Dr Caryl Comes on 12/07 that she did not feel well.  She complains of fatigue and increased palpitations vs a "very forceful heart beat."  She was unsure if changes had been made to her PPM.  She states her BP has been consistently in the range of 118/80 and feels her elevated BP in the office was from nervousness.   Requested pt send a transmission for review.  Will forward to device clinic to review transmission or assist if needed with sending.  Pt verbalizes understanding and agrees with current plan.

## 2022-04-11 NOTE — Telephone Encounter (Signed)
Patient c/o Palpitations:  High priority if patient c/o lightheadedness, shortness of breath, or chest pain  How long have you had palpitations/irregular HR/ Afib? Are you having the symptoms now? Since last appt, no has not noticed it today  Are you currently experiencing lightheadedness, SOB or CP? fatigue  Do you have a history of afib (atrial fibrillation) or irregular heart rhythm? no  Have you checked your BP or HR? (document readings if available): her BP has been a little low 118/80  Are you experiencing any other symptoms? Fatigue  Patient states since her last appointment she has been feeling very tired and has been noticing a forceful heartbeat.

## 2022-04-12 NOTE — Telephone Encounter (Signed)
Spoke with pt and advised of normal device function, no alerts triggered and pt is in NSR.  Pt verbalizes understanding and thanked Therapist, sports for the call.

## 2022-04-12 NOTE — Telephone Encounter (Signed)
Remote transmission received and reviewed. Shows normal device function. Patient is in NSR and no alerts triggered. Do not see any changes made since last OV.

## 2022-04-29 NOTE — Progress Notes (Signed)
Remote pacemaker transmission.   

## 2022-05-05 ENCOUNTER — Ambulatory Visit (HOSPITAL_COMMUNITY): Payer: 59 | Attending: Internal Medicine

## 2022-05-05 DIAGNOSIS — I425 Other restrictive cardiomyopathy: Secondary | ICD-10-CM | POA: Diagnosis present

## 2022-05-05 DIAGNOSIS — D8685 Sarcoid myocarditis: Secondary | ICD-10-CM | POA: Diagnosis not present

## 2022-05-05 LAB — ECHOCARDIOGRAM COMPLETE
Area-P 1/2: 2.03 cm2
S' Lateral: 3.7 cm

## 2022-06-20 ENCOUNTER — Ambulatory Visit (INDEPENDENT_AMBULATORY_CARE_PROVIDER_SITE_OTHER): Payer: 59

## 2022-06-20 DIAGNOSIS — I441 Atrioventricular block, second degree: Secondary | ICD-10-CM | POA: Diagnosis not present

## 2022-06-20 LAB — CUP PACEART REMOTE DEVICE CHECK
Battery Remaining Longevity: 79 mo
Battery Voltage: 2.97 V
Brady Statistic AP VP Percent: 20.52 %
Brady Statistic AP VS Percent: 0.08 %
Brady Statistic AS VP Percent: 73.45 %
Brady Statistic AS VS Percent: 5.96 %
Brady Statistic RA Percent Paced: 22.22 %
Brady Statistic RV Percent Paced: 93.97 %
Date Time Interrogation Session: 20240219005206
Implantable Lead Connection Status: 753985
Implantable Lead Connection Status: 753985
Implantable Lead Implant Date: 20200213
Implantable Lead Implant Date: 20200213
Implantable Lead Location: 753859
Implantable Lead Location: 753860
Implantable Lead Model: 5076
Implantable Lead Model: 5076
Implantable Pulse Generator Implant Date: 20200213
Lead Channel Impedance Value: 323 Ohm
Lead Channel Impedance Value: 323 Ohm
Lead Channel Impedance Value: 399 Ohm
Lead Channel Impedance Value: 418 Ohm
Lead Channel Pacing Threshold Amplitude: 0.625 V
Lead Channel Pacing Threshold Amplitude: 1.25 V
Lead Channel Pacing Threshold Pulse Width: 0.4 ms
Lead Channel Pacing Threshold Pulse Width: 0.4 ms
Lead Channel Sensing Intrinsic Amplitude: 1.375 mV
Lead Channel Sensing Intrinsic Amplitude: 1.375 mV
Lead Channel Sensing Intrinsic Amplitude: 7.125 mV
Lead Channel Sensing Intrinsic Amplitude: 7.125 mV
Lead Channel Setting Pacing Amplitude: 1.5 V
Lead Channel Setting Pacing Amplitude: 2.5 V
Lead Channel Setting Pacing Pulse Width: 0.4 ms
Lead Channel Setting Sensing Sensitivity: 2 mV
Zone Setting Status: 755011
Zone Setting Status: 755011

## 2022-08-03 NOTE — Progress Notes (Signed)
Remote pacemaker transmission.   

## 2022-09-05 ENCOUNTER — Other Ambulatory Visit: Payer: Self-pay | Admitting: Endocrinology

## 2022-09-05 DIAGNOSIS — E041 Nontoxic single thyroid nodule: Secondary | ICD-10-CM

## 2022-09-19 ENCOUNTER — Ambulatory Visit (INDEPENDENT_AMBULATORY_CARE_PROVIDER_SITE_OTHER): Payer: 59

## 2022-09-19 DIAGNOSIS — I441 Atrioventricular block, second degree: Secondary | ICD-10-CM | POA: Diagnosis not present

## 2022-09-27 LAB — CUP PACEART REMOTE DEVICE CHECK
Battery Remaining Longevity: 78 mo
Battery Voltage: 2.97 V
Brady Statistic AP VP Percent: 20.64 %
Brady Statistic AP VS Percent: 0.07 %
Brady Statistic AS VP Percent: 75.81 %
Brady Statistic AS VS Percent: 3.48 %
Brady Statistic RA Percent Paced: 21.53 %
Brady Statistic RV Percent Paced: 96.45 %
Date Time Interrogation Session: 20240526014652
Implantable Lead Connection Status: 753985
Implantable Lead Connection Status: 753985
Implantable Lead Implant Date: 20200213
Implantable Lead Implant Date: 20200213
Implantable Lead Location: 753859
Implantable Lead Location: 753860
Implantable Lead Model: 5076
Implantable Lead Model: 5076
Implantable Pulse Generator Implant Date: 20200213
Lead Channel Impedance Value: 380 Ohm
Lead Channel Impedance Value: 380 Ohm
Lead Channel Impedance Value: 437 Ohm
Lead Channel Impedance Value: 475 Ohm
Lead Channel Pacing Threshold Amplitude: 0.625 V
Lead Channel Pacing Threshold Amplitude: 1 V
Lead Channel Pacing Threshold Pulse Width: 0.4 ms
Lead Channel Pacing Threshold Pulse Width: 0.4 ms
Lead Channel Sensing Intrinsic Amplitude: 3.75 mV
Lead Channel Sensing Intrinsic Amplitude: 3.75 mV
Lead Channel Sensing Intrinsic Amplitude: 9 mV
Lead Channel Sensing Intrinsic Amplitude: 9 mV
Lead Channel Setting Pacing Amplitude: 1.5 V
Lead Channel Setting Pacing Amplitude: 2 V
Lead Channel Setting Pacing Pulse Width: 0.4 ms
Lead Channel Setting Sensing Sensitivity: 2 mV
Zone Setting Status: 755011
Zone Setting Status: 755011

## 2022-09-29 ENCOUNTER — Ambulatory Visit
Admission: RE | Admit: 2022-09-29 | Discharge: 2022-09-29 | Disposition: A | Discharge status: Home / Self Care | Payer: 59 | Source: Ambulatory Visit | Attending: Endocrinology | Admitting: Endocrinology

## 2022-09-29 DIAGNOSIS — E041 Nontoxic single thyroid nodule: Secondary | ICD-10-CM

## 2022-10-18 NOTE — Progress Notes (Signed)
Remote pacemaker transmission.   

## 2022-12-19 ENCOUNTER — Ambulatory Visit (INDEPENDENT_AMBULATORY_CARE_PROVIDER_SITE_OTHER): Payer: 59

## 2022-12-19 DIAGNOSIS — I441 Atrioventricular block, second degree: Secondary | ICD-10-CM | POA: Diagnosis not present

## 2022-12-20 LAB — CUP PACEART REMOTE DEVICE CHECK
Battery Remaining Longevity: 68 mo
Battery Voltage: 2.96 V
Brady Statistic AP VP Percent: 26.72 %
Brady Statistic AP VS Percent: 0.09 %
Brady Statistic AS VP Percent: 67.9 %
Brady Statistic AS VS Percent: 5.28 %
Brady Statistic RA Percent Paced: 28.53 %
Brady Statistic RV Percent Paced: 94.62 %
Date Time Interrogation Session: 20240819174129
Implantable Lead Connection Status: 753985
Implantable Lead Connection Status: 753985
Implantable Lead Implant Date: 20200213
Implantable Lead Implant Date: 20200213
Implantable Lead Location: 753859
Implantable Lead Location: 753860
Implantable Lead Model: 5076
Implantable Lead Model: 5076
Implantable Pulse Generator Implant Date: 20200213
Lead Channel Impedance Value: 304 Ohm
Lead Channel Impedance Value: 323 Ohm
Lead Channel Impedance Value: 380 Ohm
Lead Channel Impedance Value: 437 Ohm
Lead Channel Pacing Threshold Amplitude: 0.625 V
Lead Channel Pacing Threshold Amplitude: 1.25 V
Lead Channel Pacing Threshold Pulse Width: 0.4 ms
Lead Channel Pacing Threshold Pulse Width: 0.4 ms
Lead Channel Sensing Intrinsic Amplitude: 10.625 mV
Lead Channel Sensing Intrinsic Amplitude: 10.625 mV
Lead Channel Sensing Intrinsic Amplitude: 2 mV
Lead Channel Sensing Intrinsic Amplitude: 2 mV
Lead Channel Setting Pacing Amplitude: 1.5 V
Lead Channel Setting Pacing Amplitude: 2.5 V
Lead Channel Setting Pacing Pulse Width: 0.4 ms
Lead Channel Setting Sensing Sensitivity: 2 mV
Zone Setting Status: 755011
Zone Setting Status: 755011

## 2022-12-29 NOTE — Progress Notes (Signed)
Remote pacemaker transmission.   

## 2023-03-20 ENCOUNTER — Ambulatory Visit (INDEPENDENT_AMBULATORY_CARE_PROVIDER_SITE_OTHER): Payer: 59

## 2023-03-20 DIAGNOSIS — I441 Atrioventricular block, second degree: Secondary | ICD-10-CM | POA: Diagnosis not present

## 2023-03-20 LAB — CUP PACEART REMOTE DEVICE CHECK
Battery Remaining Longevity: 65 mo
Battery Voltage: 2.96 V
Brady Statistic AP VP Percent: 29.69 %
Brady Statistic AP VS Percent: 0.12 %
Brady Statistic AS VP Percent: 64.27 %
Brady Statistic AS VS Percent: 5.91 %
Brady Statistic RA Percent Paced: 31.87 %
Brady Statistic RV Percent Paced: 93.96 %
Date Time Interrogation Session: 20241117222256
Implantable Lead Connection Status: 753985
Implantable Lead Connection Status: 753985
Implantable Lead Implant Date: 20200213
Implantable Lead Implant Date: 20200213
Implantable Lead Location: 753859
Implantable Lead Location: 753860
Implantable Lead Model: 5076
Implantable Lead Model: 5076
Implantable Pulse Generator Implant Date: 20200213
Lead Channel Impedance Value: 304 Ohm
Lead Channel Impedance Value: 323 Ohm
Lead Channel Impedance Value: 380 Ohm
Lead Channel Impedance Value: 437 Ohm
Lead Channel Pacing Threshold Amplitude: 0.625 V
Lead Channel Pacing Threshold Amplitude: 1.375 V
Lead Channel Pacing Threshold Pulse Width: 0.4 ms
Lead Channel Pacing Threshold Pulse Width: 0.4 ms
Lead Channel Sensing Intrinsic Amplitude: 10.625 mV
Lead Channel Sensing Intrinsic Amplitude: 10.625 mV
Lead Channel Sensing Intrinsic Amplitude: 2.875 mV
Lead Channel Sensing Intrinsic Amplitude: 2.875 mV
Lead Channel Setting Pacing Amplitude: 1.5 V
Lead Channel Setting Pacing Amplitude: 2.75 V
Lead Channel Setting Pacing Pulse Width: 0.4 ms
Lead Channel Setting Sensing Sensitivity: 2 mV
Zone Setting Status: 755011
Zone Setting Status: 755011

## 2023-04-11 ENCOUNTER — Ambulatory Visit: Payer: 59 | Attending: Internal Medicine | Admitting: Internal Medicine

## 2023-04-11 ENCOUNTER — Encounter: Payer: Self-pay | Admitting: Internal Medicine

## 2023-04-11 VITALS — BP 124/86 | HR 67 | Ht 68.5 in | Wt 153.0 lb

## 2023-04-11 DIAGNOSIS — Z950 Presence of cardiac pacemaker: Secondary | ICD-10-CM

## 2023-04-11 DIAGNOSIS — I442 Atrioventricular block, complete: Secondary | ICD-10-CM | POA: Diagnosis not present

## 2023-04-11 DIAGNOSIS — D8685 Sarcoid myocarditis: Secondary | ICD-10-CM

## 2023-04-11 NOTE — Progress Notes (Signed)
Patient Care Team: Merri Brunette, MD as PCP - General (Internal Medicine) Rennis Golden Lisette Abu, MD as PCP - Cardiology (Cardiology)   HPI  Yvonne Parker is a 62 y.o. female Seen in follow-up for heart block-advanced and PVCs.  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  The mechanism of her heart block was investigated with various modalities and multiple conversations as well as visits with Dr. Beverlyn Roux at [facility].  There were issues related to the image review that is to say the perfusion/FDG uptake images were never indeed seen side-by-side by him.  Ultimately as she was asymptomatic it was elected to dismiss the PET data and in the absence of MRI LGE, that associated with a very low angle rate, 2 not undertake empiric and pressor therapy  The patient denies chest pain, shortness of breath, nocturnal dyspnea, orthopnea or peripheral edema.  There have been no palpitations, lightheadedness or syncope.    Anxiety has been under better control and she thinks she was hyperthyroid and has been better since downtitration of thryoid   DATE TEST EF   2/20  cMRI  60 % LGE neg  9/21 Echo   50-55 %   1/24 Echo  50-55%            Does have disabling anxiety.  Was on Lexapro for couple years dating back to onset of her arrhythmia issues, came off but has struggled over recent months.  Dominated by work issues Records and Results Reviewed   Past Medical History:  Diagnosis Date   Heart block     Past Surgical History:  Procedure Laterality Date   OVARIAN CYST REMOVAL     PACEMAKER IMPLANT N/A 06/14/2018   Procedure: PACEMAKER IMPLANT;  Surgeon: Marinus Maw, MD;  Location: [facility] INVASIVE CV [facility];  Service: Cardiovascular;  Laterality: N/A;   TRANSTHORACIC ECHOCARDIOGRAM  06/2008   EF=>55%, mobile IAS; mild MR; trace TR; mild pulm valve regurg    Current Meds  Medication Sig   CALCIUM PO Take by mouth. sometimes    levothyroxine (SYNTHROID, LEVOTHROID) 50 MCG tablet Take 50 mcg by mouth daily.    Multiple Vitamin (MULTIVITAMIN PO) Take by mouth. sometimes   VITAMIN D PO Take by mouth daily.    Allergies  Allergen Reactions   Penicillins Rash and Other (See Comments)    Rash Did it involve swelling of the face/tongue/throat, SOB, or low BP? No Did it involve sudden or severe rash/hives, skin peeling, or any reaction on the inside of your mouth or nose? No Did you need to seek medical attention at a hospital or doctor's office? No When did it last happen?      A long time ago If all above answers are "NO", may proceed with cephalosporin use.    Sulfa Antibiotics Rash and Other (See Comments)    Body aches, feverish, red rash       Review of Systems negative except from HPI and PMH  Physical Exam BP 124/86   Pulse 67   Ht 5' 8.5" (1.74 m)   Wt 153 lb (69.4 kg)   LMP 10/05/2011   SpO2 99%   BMI 22.93 kg/m  Well developed and well nourished in no acute distress HENT normal Neck supple with JVP-flat Clear Device pocket well healed; without hematoma or erythema.  There is no tethering  Regular rate and rhythm, no  gallop No  murmur Abd-soft with active BS  No Clubbing cyanosis  edema Skin-warm and dry A & Oriented  Grossly normal sensory and motor function tic  ECG NSR P-synchronous/ AV  pacing Upright QRS 1 and RS lead V1  Device function is normal. Programming changes none  See Paceart for details    CrCl cannot be calculated (Patient's most recent lab result is older than the maximum 21 days allowed.).   Assessment and  Plan  Complete heart block ? Sarcoid  Hypertension  Pacemaker-Medtronic  Anxiety   Doing much better.  Functional status is improved.  Anxiety is also improved.   Reviewed the possibility of pacemaker cardiomyopathy, but no symptoms at this point to suggest.  Recheck an echo next year.    Current medicines are reviewed at length with the patient  today .  The patient does not  have concerns regarding medicines.

## 2023-04-11 NOTE — Patient Instructions (Signed)

## 2023-04-14 NOTE — Progress Notes (Signed)
Remote pacemaker transmission.   

## 2023-04-15 LAB — CUP PACEART INCLINIC DEVICE CHECK
Date Time Interrogation Session: 20241210193810
Implantable Lead Connection Status: 753985
Implantable Lead Connection Status: 753985
Implantable Lead Implant Date: 20200213
Implantable Lead Implant Date: 20200213
Implantable Lead Location: 753859
Implantable Lead Location: 753860
Implantable Lead Model: 5076
Implantable Lead Model: 5076
Implantable Pulse Generator Implant Date: 20200213

## 2023-06-19 ENCOUNTER — Ambulatory Visit: Payer: 59

## 2023-06-19 DIAGNOSIS — I441 Atrioventricular block, second degree: Secondary | ICD-10-CM | POA: Diagnosis not present

## 2023-06-20 LAB — CUP PACEART REMOTE DEVICE CHECK
Battery Remaining Longevity: 53 mo
Battery Voltage: 2.95 V
Brady Statistic AP VP Percent: 25.12 %
Brady Statistic AP VS Percent: 0.11 %
Brady Statistic AS VP Percent: 68.77 %
Brady Statistic AS VS Percent: 6.01 %
Brady Statistic RA Percent Paced: 27.27 %
Brady Statistic RV Percent Paced: 93.89 %
Date Time Interrogation Session: 20250216205245
Implantable Lead Connection Status: 753985
Implantable Lead Connection Status: 753985
Implantable Lead Implant Date: 20200213
Implantable Lead Implant Date: 20200213
Implantable Lead Location: 753859
Implantable Lead Location: 753860
Implantable Lead Model: 5076
Implantable Lead Model: 5076
Implantable Pulse Generator Implant Date: 20200213
Lead Channel Impedance Value: 323 Ohm
Lead Channel Impedance Value: 342 Ohm
Lead Channel Impedance Value: 399 Ohm
Lead Channel Impedance Value: 437 Ohm
Lead Channel Pacing Threshold Amplitude: 0.625 V
Lead Channel Pacing Threshold Amplitude: 1.5 V
Lead Channel Pacing Threshold Pulse Width: 0.4 ms
Lead Channel Pacing Threshold Pulse Width: 0.4 ms
Lead Channel Sensing Intrinsic Amplitude: 2.875 mV
Lead Channel Sensing Intrinsic Amplitude: 2.875 mV
Lead Channel Sensing Intrinsic Amplitude: 8.625 mV
Lead Channel Sensing Intrinsic Amplitude: 8.625 mV
Lead Channel Setting Pacing Amplitude: 1.5 V
Lead Channel Setting Pacing Amplitude: 3 V
Lead Channel Setting Pacing Pulse Width: 0.4 ms
Lead Channel Setting Sensing Sensitivity: 2 mV
Zone Setting Status: 755011
Zone Setting Status: 755011

## 2023-06-28 ENCOUNTER — Telehealth (INDEPENDENT_AMBULATORY_CARE_PROVIDER_SITE_OTHER): Payer: Self-pay | Admitting: Otolaryngology

## 2023-06-28 ENCOUNTER — Other Ambulatory Visit (HOSPITAL_COMMUNITY): Payer: Self-pay | Admitting: Registered Nurse

## 2023-06-28 DIAGNOSIS — E785 Hyperlipidemia, unspecified: Secondary | ICD-10-CM

## 2023-06-28 NOTE — Telephone Encounter (Signed)
 LVM to confirm appt & location [id number]

## 2023-06-29 ENCOUNTER — Ambulatory Visit (INDEPENDENT_AMBULATORY_CARE_PROVIDER_SITE_OTHER): Payer: 59 | Admitting: Otolaryngology

## 2023-06-29 ENCOUNTER — Encounter (INDEPENDENT_AMBULATORY_CARE_PROVIDER_SITE_OTHER): Payer: Self-pay

## 2023-06-29 VITALS — BP 131/84 | HR 67 | Ht 69.0 in | Wt 145.0 lb

## 2023-06-29 DIAGNOSIS — R0981 Nasal congestion: Secondary | ICD-10-CM | POA: Diagnosis not present

## 2023-06-29 DIAGNOSIS — J343 Hypertrophy of nasal turbinates: Secondary | ICD-10-CM

## 2023-06-29 DIAGNOSIS — J310 Chronic rhinitis: Secondary | ICD-10-CM | POA: Diagnosis not present

## 2023-07-01 DIAGNOSIS — J343 Hypertrophy of nasal turbinates: Secondary | ICD-10-CM | POA: Insufficient documentation

## 2023-07-01 DIAGNOSIS — J310 Chronic rhinitis: Secondary | ICD-10-CM | POA: Insufficient documentation

## 2023-07-01 NOTE — Progress Notes (Signed)
 Patient ID: Yvonne Parker, female   DOB: 09-11-1960, 63 y.o.   MRN: [id number]  Follow-up: Chronic nasal obstruction  HPI: The patient is a 63 year old female who returns today for follow-up evaluation.  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  She underwent septoplasty and bilateral turbinate reduction surgery in August 2024.  The patient returns today reporting significant improvement in her nasal breathing.  She has noted only mild nasal congestion during the allergy seasons.  She denies any facial pain, fever, or visual change.  Exam: General: Communicates without difficulty, well nourished, no acute distress. Head: Normocephalic, no evidence injury, no tenderness, facial buttresses intact without stepoff. Face/sinus: No tenderness to palpation and percussion. Facial movement is normal and symmetric. Eyes: PERRL, EOMI. No scleral icterus, conjunctivae clear. Neuro: CN II exam reveals vision grossly intact.  No nystagmus at any point of gaze. Ears: Auricles well formed without lesions.  Ear canals are intact without mass or lesion.  No erythema or edema is appreciated.  The TMs are intact without fluid. Nose: External evaluation reveals normal support and skin without lesions.  Dorsum is intact.  Anterior rhinoscopy reveals congested mucosa over anterior aspect of inferior turbinates and intact septum.  No purulence noted. Oral:  Oral cavity and oropharynx are intact, symmetric, without erythema or edema.  Mucosa is moist without lesions. Neck: Full range of motion without pain.  There is no significant lymphadenopathy.  No masses palpable.  Thyroid bed within normal limits to palpation.  Parotid glands and submandibular glands equal bilaterally without mass.  Trachea is midline. Neuro:  CN 2-12 grossly intact.   Assessment: 1.  Chronic rhinitis with mild nasal mucosal congestion. 2.  Her septum and  turbinates are well-healed.  Her nasal passageways are patent bilaterally.  Plan: 1.  The physical exam findings are reviewed with the patient. 2.  Flonase nasal spray and nasal saline irrigation as needed. 3.  The patient is encouraged to call with any questions or concerns.

## 2023-07-07 ENCOUNTER — Telehealth: Payer: Self-pay | Admitting: Internal Medicine

## 2023-07-07 NOTE — Telephone Encounter (Signed)
 Pt called in stating her PCP ordered her a CT calcium score and she wants to know if Dr. Graciela Husbands thinks this is necessary, please advise. 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

## 2023-07-13 ENCOUNTER — Ambulatory Visit (HOSPITAL_COMMUNITY)
Admission: RE | Admit: 2023-07-13 | Discharge: 2023-07-13 | Disposition: A | Discharge status: Home / Self Care | Payer: Self-pay | Source: Ambulatory Visit | Attending: Registered Nurse | Admitting: Registered Nurse

## 2023-07-13 DIAGNOSIS — E785 Hyperlipidemia, unspecified: Secondary | ICD-10-CM | POA: Insufficient documentation

## 2023-07-18 ENCOUNTER — Encounter: Payer: Self-pay | Admitting: Internal Medicine

## 2023-07-28 NOTE — Addendum Note (Signed)
 Addended by: Geralyn Flash D on: 07/28/2023 01:38 PM   Modules accepted: Orders

## 2023-07-28 NOTE — Progress Notes (Signed)
 Remote pacemaker transmission.

## 2023-09-18 ENCOUNTER — Ambulatory Visit (INDEPENDENT_AMBULATORY_CARE_PROVIDER_SITE_OTHER): Payer: 59

## 2023-09-18 DIAGNOSIS — I441 Atrioventricular block, second degree: Secondary | ICD-10-CM | POA: Diagnosis not present

## 2023-09-26 LAB — CUP PACEART REMOTE DEVICE CHECK
Battery Remaining Longevity: 47 mo
Battery Voltage: 2.95 V
Brady Statistic AP VP Percent: 24.8 %
Brady Statistic AP VS Percent: 0.14 %
Brady Statistic AS VP Percent: 71.85 %
Brady Statistic AS VS Percent: 3.22 %
Brady Statistic RA Percent Paced: 25.87 %
Brady Statistic RV Percent Paced: 96.65 %
Date Time Interrogation Session: 20250524203511
Implantable Lead Connection Status: 753985
Implantable Lead Connection Status: 753985
Implantable Lead Implant Date: 20200213
Implantable Lead Implant Date: 20200213
Implantable Lead Location: 753859
Implantable Lead Location: 753860
Implantable Lead Model: 5076
Implantable Lead Model: 5076
Implantable Pulse Generator Implant Date: 20200213
Lead Channel Impedance Value: 323 Ohm
Lead Channel Impedance Value: 342 Ohm
Lead Channel Impedance Value: 418 Ohm
Lead Channel Impedance Value: 437 Ohm
Lead Channel Pacing Threshold Amplitude: 0.5 V
Lead Channel Pacing Threshold Amplitude: 1.25 V
Lead Channel Pacing Threshold Pulse Width: 0.4 ms
Lead Channel Pacing Threshold Pulse Width: 0.4 ms
Lead Channel Sensing Intrinsic Amplitude: 2.125 mV
Lead Channel Sensing Intrinsic Amplitude: 2.125 mV
Lead Channel Sensing Intrinsic Amplitude: 8.625 mV
Lead Channel Sensing Intrinsic Amplitude: 8.625 mV
Lead Channel Setting Pacing Amplitude: 1.5 V
Lead Channel Setting Pacing Amplitude: 2.75 V
Lead Channel Setting Pacing Pulse Width: 0.4 ms
Lead Channel Setting Sensing Sensitivity: 2 mV
Zone Setting Status: 755011
Zone Setting Status: 755011

## 2023-09-28 ENCOUNTER — Ambulatory Visit: Payer: Self-pay | Admitting: Cardiovascular Disease

## 2023-11-08 NOTE — Progress Notes (Signed)
 Remote pacemaker transmission.

## 2023-11-08 NOTE — Addendum Note (Signed)
 Addended by: TAWNI DRILLING D on: 11/08/2023 04:19 PM   Modules accepted: Orders

## 2023-12-18 ENCOUNTER — Ambulatory Visit (INDEPENDENT_AMBULATORY_CARE_PROVIDER_SITE_OTHER): Payer: 59

## 2023-12-18 DIAGNOSIS — I441 Atrioventricular block, second degree: Secondary | ICD-10-CM

## 2023-12-19 LAB — CUP PACEART REMOTE DEVICE CHECK
Battery Remaining Longevity: 45 mo
Battery Voltage: 2.95 V
Brady Statistic AP VP Percent: 33.5 %
Brady Statistic AP VS Percent: 0.34 %
Brady Statistic AS VP Percent: 59.42 %
Brady Statistic AS VS Percent: 6.73 %
Brady Statistic RA Percent Paced: 35.99 %
Brady Statistic RV Percent Paced: 92.93 %
Date Time Interrogation Session: 20250818033239
Implantable Lead Connection Status: 753985
Implantable Lead Connection Status: 753985
Implantable Lead Implant Date: 20200213
Implantable Lead Implant Date: 20200213
Implantable Lead Location: 753859
Implantable Lead Location: 753860
Implantable Lead Model: 5076
Implantable Lead Model: 5076
Implantable Pulse Generator Implant Date: 20200213
Lead Channel Impedance Value: 304 Ohm
Lead Channel Impedance Value: 323 Ohm
Lead Channel Impedance Value: 380 Ohm
Lead Channel Impedance Value: 418 Ohm
Lead Channel Pacing Threshold Amplitude: 0.625 V
Lead Channel Pacing Threshold Amplitude: 1.125 V
Lead Channel Pacing Threshold Pulse Width: 0.4 ms
Lead Channel Pacing Threshold Pulse Width: 0.4 ms
Lead Channel Sensing Intrinsic Amplitude: 2.25 mV
Lead Channel Sensing Intrinsic Amplitude: 2.25 mV
Lead Channel Sensing Intrinsic Amplitude: 9.375 mV
Lead Channel Sensing Intrinsic Amplitude: 9.375 mV
Lead Channel Setting Pacing Amplitude: 1.5 V
Lead Channel Setting Pacing Amplitude: 2.75 V
Lead Channel Setting Pacing Pulse Width: 0.4 ms
Lead Channel Setting Sensing Sensitivity: 2 mV
Zone Setting Status: 755011
Zone Setting Status: 755011

## 2023-12-21 ENCOUNTER — Ambulatory Visit: Payer: Self-pay | Admitting: Cardiovascular Disease

## 2024-01-24 NOTE — Progress Notes (Signed)
 Remote PPM Transmission

## 2024-03-18 ENCOUNTER — Ambulatory Visit (INDEPENDENT_AMBULATORY_CARE_PROVIDER_SITE_OTHER): Payer: 59

## 2024-03-18 DIAGNOSIS — I441 Atrioventricular block, second degree: Secondary | ICD-10-CM | POA: Diagnosis not present

## 2024-03-18 LAB — CUP PACEART REMOTE DEVICE CHECK
Battery Remaining Longevity: 42 mo
Battery Voltage: 2.94 V
Brady Statistic AP VP Percent: 24.52 %
Brady Statistic AP VS Percent: 0.21 %
Brady Statistic AS VP Percent: 68.54 %
Brady Statistic AS VS Percent: 6.73 %
Brady Statistic RA Percent Paced: 26.59 %
Brady Statistic RV Percent Paced: 93.05 %
Date Time Interrogation Session: 20251116194105
Implantable Lead Connection Status: 753985
Implantable Lead Connection Status: 753985
Implantable Lead Implant Date: 20200213
Implantable Lead Implant Date: 20200213
Implantable Lead Location: 753859
Implantable Lead Location: 753860
Implantable Lead Model: 5076
Implantable Lead Model: 5076
Implantable Pulse Generator Implant Date: 20200213
Lead Channel Impedance Value: 323 Ohm
Lead Channel Impedance Value: 342 Ohm
Lead Channel Impedance Value: 380 Ohm
Lead Channel Impedance Value: 456 Ohm
Lead Channel Pacing Threshold Amplitude: 0.625 V
Lead Channel Pacing Threshold Amplitude: 1.375 V
Lead Channel Pacing Threshold Pulse Width: 0.4 ms
Lead Channel Pacing Threshold Pulse Width: 0.4 ms
Lead Channel Sensing Intrinsic Amplitude: 2.75 mV
Lead Channel Sensing Intrinsic Amplitude: 2.75 mV
Lead Channel Sensing Intrinsic Amplitude: 7.875 mV
Lead Channel Sensing Intrinsic Amplitude: 7.875 mV
Lead Channel Setting Pacing Amplitude: 1.5 V
Lead Channel Setting Pacing Amplitude: 3 V
Lead Channel Setting Pacing Pulse Width: 0.4 ms
Lead Channel Setting Sensing Sensitivity: 2 mV
Zone Setting Status: 755011
Zone Setting Status: 755011

## 2024-03-20 NOTE — Progress Notes (Signed)
 Remote PPM Transmission

## 2024-04-01 ENCOUNTER — Ambulatory Visit: Payer: Self-pay | Admitting: Cardiovascular Disease

## 2024-04-08 ENCOUNTER — Ambulatory Visit: Attending: Cardiovascular Disease | Admitting: Cardiovascular Disease

## 2024-04-08 ENCOUNTER — Ambulatory Visit: Admitting: Cardiovascular Disease

## 2024-04-08 VITALS — BP 122/60 | HR 80 | Ht 69.0 in | Wt 157.0 lb

## 2024-04-08 DIAGNOSIS — I442 Atrioventricular block, complete: Secondary | ICD-10-CM

## 2024-04-08 DIAGNOSIS — I441 Atrioventricular block, second degree: Secondary | ICD-10-CM | POA: Diagnosis not present

## 2024-04-08 LAB — CUP PACEART INCLINIC DEVICE CHECK
Battery Remaining Longevity: 43 mo
Battery Voltage: 2.94 V
Brady Statistic AP VP Percent: 27.13 %
Brady Statistic AP VS Percent: 0.2 %
Brady Statistic AS VP Percent: 67.05 %
Brady Statistic AS VS Percent: 5.62 %
Brady Statistic RA Percent Paced: 29.06 %
Brady Statistic RV Percent Paced: 94.18 %
Date Time Interrogation Session: 20251208160050
Implantable Lead Connection Status: 753985
Implantable Lead Connection Status: 753985
Implantable Lead Implant Date: 20200213
Implantable Lead Implant Date: 20200213
Implantable Lead Location: 753859
Implantable Lead Location: 753860
Implantable Lead Model: 5076
Implantable Lead Model: 5076
Implantable Pulse Generator Implant Date: 20200213
Lead Channel Impedance Value: 342 Ohm
Lead Channel Impedance Value: 342 Ohm
Lead Channel Impedance Value: 399 Ohm
Lead Channel Impedance Value: 456 Ohm
Lead Channel Pacing Threshold Amplitude: 0.625 V
Lead Channel Pacing Threshold Amplitude: 1.125 V
Lead Channel Pacing Threshold Pulse Width: 0.4 ms
Lead Channel Pacing Threshold Pulse Width: 0.4 ms
Lead Channel Sensing Intrinsic Amplitude: 0 mV
Lead Channel Sensing Intrinsic Amplitude: 2.75 mV
Lead Channel Sensing Intrinsic Amplitude: 3.25 mV
Lead Channel Sensing Intrinsic Amplitude: 7.875 mV
Lead Channel Sensing Intrinsic Amplitude: 7.875 mV
Lead Channel Setting Pacing Amplitude: 1.5 V
Lead Channel Setting Pacing Amplitude: 2.75 V
Lead Channel Setting Pacing Pulse Width: 0.4 ms
Lead Channel Setting Sensing Sensitivity: 2 mV
Zone Setting Status: 755011
Zone Setting Status: 755011

## 2024-04-08 NOTE — Progress Notes (Signed)
  Electrophysiology Office Note:    Date:  04/08/2024   ID:  Yvonne Parker, DOB [birth year], MRN [id number]  PCP:  Yvonne Nottingham, MD   [facility] Providers Cardiologist:  Yvonne JAYSON Maxcy, MD     Referring MD: Yvonne Nottingham, MD   History of Present Illness:    Yvonne Parker is a 63 y.o. female with a medical history significant for advanced heart block and PVCs, who presents for electrophysiology follow-up.       History of Present Illness She has a Medtronic pacemaker placed for complete heart block.  She had extensive evaluation for the cause of her heart block.  There was some concern for sarcoid         Today, she reports that she is doing well and has no acute complaints. she has no device related complaints -- no new tenderness, drainage, redness.   EKGs/Labs/Other Studies Reviewed Today:     Echocardiogram:  TTE January 2024 LVEF 50 to 55%.  Mild mitral valve prolapse.  Mild mitral regurgitation     EKG:   EKG Interpretation Date/Time:  Monday April 08 2024 14:57:36 EST Ventricular Rate:  70 PR Interval:  250 QRS Duration:  158 QT Interval:  442 QTC Calculation: 477 R Axis:   -74  Text Interpretation: Atrial-sensed ventricular-paced rhythm with prolonged AV conduction with occasional Premature ventricular complexes When compared with ECG of 11-Apr-2023 16:13, Premature ventricular complexes are now Present Vent. rate has increased BY   3 BPM Confirmed by Parker Yvonne [phone]) on 04/08/2024 3:07:40 PM     Physical Exam:    VS:  BP 122/60 (BP Location: Left Arm, Patient Position: Sitting, Cuff Size: Normal)   Pulse 80   Ht 5' 9 (1.753 m)   Wt 157 lb (71.2 kg)   LMP 10/05/2011   SpO2 98%   BMI 23.18 kg/m     Wt Readings from Last 3 Encounters:  04/08/24 157 lb (71.2 kg)  06/29/23 145 lb (65.8 kg)  04/11/23 153 lb (69.4 kg)     GEN: Well nourished, well developed in no acute distress CARDIAC: RRR, no murmurs, rubs,  gallops RESPIRATORY:  Normal work of breathing MUSCULOSKELETAL: no edema    ASSESSMENT & PLAN:     Complete heart block Medtronic dual-chamber pacemaker in place, functioning normally I reviewed today's interrogation.  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 Cardiac MRI February 2020 showed no evidence of infiltrative or inflammatory cardiomyopathy     Signed, Yvonne FORBES Nancey, MD  04/08/2024 3:07 PM    [facility]

## 2024-04-08 NOTE — Patient Instructions (Signed)
 Medication Instructions:  Your physician recommends that you continue on your current medications as directed. Please refer to the Current Medication list given to you today.  *If you need a refill on your cardiac medications before your next appointment, please call your pharmacy*  Lab Work: None ordered.  If you have labs (blood work) drawn today and your tests are completely normal, you will receive your results only by: MyChart Message (if you have MyChart) OR A paper copy in the mail If you have any lab test that is abnormal or we need to change your treatment, we will call you to review the results.  Testing/Procedures: None ordered.   Follow-Up: At [facility], you and your health needs are our priority.  As part of our continuing mission to provide you with exceptional heart care, our providers are all part of one team.  This team includes your primary Cardiologist (physician) and Advanced Practice Providers or APPs (Physician Assistants and Nurse Practitioners) who all work together to provide you with the care you need, when you need it.  Your next appointment:   12 month(s)  Provider:   You will see one of the following Advanced Practice Providers on your designated Care Team:   Charlies Arthur, [place] Ozell Jodie Passey, PA-C Suzann Riddle, NP Daphne Barrack, NP Artist Pouch, PA-C    We recommend signing up for the patient portal called MyChart.  Sign up information is provided on this After Visit Summary.  MyChart is used to connect with patients for Virtual Visits (Telemedicine).  Patients are able to view lab/test results, encounter notes, upcoming appointments, etc.  Non-urgent messages can be sent to your provider as well.   To learn more about what you can do with MyChart, go to [email or web address].

## 2024-04-19 ENCOUNTER — Ambulatory Visit: Payer: Self-pay | Admitting: Cardiovascular Disease

## 2024-04-26 ENCOUNTER — Ambulatory Visit: Admitting: Pulmonary Disease
